# Patient Record
Sex: Male | Born: 1975 | Race: White | Hispanic: No | Marital: Married | Smoking: Never smoker
Health system: Southern US, Community
[De-identification: ages and names within clinical notes are randomized; demographics above are authoritative.]

## PROBLEM LIST (undated history)

## (undated) DIAGNOSIS — I10 Essential (primary) hypertension: Secondary | ICD-10-CM

## (undated) DIAGNOSIS — B029 Zoster without complications: Secondary | ICD-10-CM

## (undated) DIAGNOSIS — E785 Hyperlipidemia, unspecified: Secondary | ICD-10-CM

## (undated) DIAGNOSIS — T7840XA Allergy, unspecified, initial encounter: Secondary | ICD-10-CM

## (undated) HISTORY — PX: ANKLE SURGERY: SHX546

## (undated) HISTORY — DX: Hyperlipidemia, unspecified: E78.5

## (undated) HISTORY — PX: FRACTURE SURGERY: SHX138

## (undated) HISTORY — DX: Zoster without complications: B02.9

## (undated) HISTORY — DX: Allergy, unspecified, initial encounter: T78.40XA

## (undated) HISTORY — DX: Essential (primary) hypertension: I10

---

## 2005-01-22 ENCOUNTER — Emergency Department (HOSPITAL_COMMUNITY): Admission: EM | Admit: 2005-01-22 | Discharge: 2005-01-22 | Payer: Self-pay | Admitting: Emergency Medicine

## 2021-11-16 ENCOUNTER — Emergency Department (HOSPITAL_COMMUNITY)
Admission: EM | Admit: 2021-11-16 | Discharge: 2021-11-17 | Disposition: A | Discharge status: Home / Self Care | Payer: 59 | Attending: Emergency Medicine | Admitting: Emergency Medicine

## 2021-11-16 ENCOUNTER — Encounter (HOSPITAL_COMMUNITY): Payer: Self-pay | Admitting: Emergency Medicine

## 2021-11-16 ENCOUNTER — Emergency Department (HOSPITAL_COMMUNITY): Payer: 59

## 2021-11-16 DIAGNOSIS — I10 Essential (primary) hypertension: Secondary | ICD-10-CM | POA: Insufficient documentation

## 2021-11-16 DIAGNOSIS — R42 Dizziness and giddiness: Secondary | ICD-10-CM | POA: Insufficient documentation

## 2021-11-16 LAB — CBC WITH DIFFERENTIAL/PLATELET
Abs Immature Granulocytes: 0.04 10*3/uL (ref 0.00–0.07)
Basophils Absolute: 0.1 10*3/uL (ref 0.0–0.1)
Basophils Relative: 1 %
Eosinophils Absolute: 0.3 10*3/uL (ref 0.0–0.5)
Eosinophils Relative: 3 %
HCT: 47.3 % (ref 39.0–52.0)
Hemoglobin: 16.6 g/dL (ref 13.0–17.0)
Immature Granulocytes: 0 %
Lymphocytes Relative: 19 %
Lymphs Abs: 1.9 10*3/uL (ref 0.7–4.0)
MCH: 31.7 pg (ref 26.0–34.0)
MCHC: 35.1 g/dL (ref 30.0–36.0)
MCV: 90.3 fL (ref 80.0–100.0)
Monocytes Absolute: 1 10*3/uL (ref 0.1–1.0)
Monocytes Relative: 10 %
Neutro Abs: 7 10*3/uL (ref 1.7–7.7)
Neutrophils Relative %: 67 %
Platelets: 236 10*3/uL (ref 150–400)
RBC: 5.24 MIL/uL (ref 4.22–5.81)
RDW: 12.5 % (ref 11.5–15.5)
WBC: 10.3 10*3/uL (ref 4.0–10.5)
nRBC: 0 % (ref 0.0–0.2)

## 2021-11-16 LAB — URINALYSIS, ROUTINE W REFLEX MICROSCOPIC
Bilirubin Urine: NEGATIVE
Glucose, UA: NEGATIVE mg/dL
Hgb urine dipstick: NEGATIVE
Ketones, ur: NEGATIVE mg/dL
Leukocytes,Ua: NEGATIVE
Nitrite: NEGATIVE
Protein, ur: NEGATIVE mg/dL
Specific Gravity, Urine: 1.002 — ABNORMAL LOW (ref 1.005–1.030)
pH: 6 (ref 5.0–8.0)

## 2021-11-16 LAB — RAPID URINE DRUG SCREEN, HOSP PERFORMED
Amphetamines: NOT DETECTED
Barbiturates: NOT DETECTED
Benzodiazepines: NOT DETECTED
Cocaine: NOT DETECTED
Opiates: NOT DETECTED
Tetrahydrocannabinol: NOT DETECTED

## 2021-11-16 MED ORDER — AMLODIPINE BESYLATE 5 MG PO TABS
10.0000 mg | ORAL_TABLET | Freq: Once | ORAL | Status: AC
  Administered 2021-11-16: 10 mg via ORAL
  Filled 2021-11-16: qty 2

## 2021-11-16 MED ORDER — CLONIDINE HCL 0.1 MG PO TABS
0.1000 mg | ORAL_TABLET | Freq: Once | ORAL | Status: AC
Start: 2021-11-16 — End: 2021-11-16
  Administered 2021-11-16: 0.1 mg via ORAL
  Filled 2021-11-16: qty 1

## 2021-11-16 NOTE — ED Provider Notes (Signed)
[facility]-EMERGENCY DEPT Provider Note   CSN: [id number] Arrival date & time: 11/16/21  1847     History  Chief Complaint  Patient presents with   Hypertension    Tony Weber is a 46 y.o. male.  HPI Patient does not have formal diagnosis of hypertension.  He does however report that since college his blood pressures always been slightly elevated.  He reports over the past few years when he checks it its been as high as 160s over 90s.  Patient denies he is ever had treatment for hypertension.  He just recently got set up for treatment with a family physician.  He denies he has had any symptoms of chest pain, shortness of breath, headache or blurred vision.  Typically he feels well.  Today he got an episode of feeling kind of flushed and lightheaded when going up some stairs at the [facility] to an event with his wife this evening.  He reports he did not feel very well so he sat down and medics checked him.  His blood pressure was elevated at 200 systolic over 100.  He did not get transported to the hospital.  They did do an EKG tracing at that time which to gauge the patient he went home to rest and was feeling better.  However he checked his blood pressure at home and it was even higher up to 220/100s.  At that time he became concerned and they came to the emergency department for further evaluation.  Patient denies any time that he had any headache or blurred vision.  He did not have dizziness or syncope or near syncope.  No chest pain or shortness of breath.  No focal weakness numbness tingling.  He did not feel that his gait was incoordinated.  He did not feel that he had loss of vision or double vision.  Patient reports at the time that he was in the [facility] and looking down through the glass to the lower arena he had the feeling that his depth perception was not quite right but aside from that no visual complaint.  That then resolved and he felt back to normal. Patient  reports he drinks about 3-6 beers daily.  He has not had any alcohol today.  He does not smoke.  He takes a [employer] product called    Home Medications Prior to Admission medications   Medication Sig Start Date End Date Taking? Authorizing Provider  cetirizine (ZYRTEC) 10 MG tablet Take 10 mg by mouth daily.   Yes [provider]  Multiple Vitamins-Minerals (MEGA MULTIVITAMIN FOR MEN) TABS Take 1 tablet by mouth daily.   Yes [provider]  oxymetazoline (AFRIN) 0.05 % nasal spray Place 1 spray into both nostrils daily as needed for congestion.   Yes [provider]      Allergies    Amoxicillin and Chocolate    Review of Systems   Review of Systems  Physical Exam Updated Vital Signs BP (!) 181/116    Pulse 92    Temp 98.3 F (36.8 C)    Resp 20    Ht 5\' 9"  (1.753 m)    Wt 104.3 kg    SpO2 95%    BMI 33.97 kg/m  Physical Exam Constitutional:      Appearance: Normal appearance.  HENT:     Head: Normocephalic and atraumatic.     Mouth/Throat:     Mouth: Mucous membranes are moist.     Pharynx: Oropharynx is  clear.  Eyes:     Extraocular Movements: Extraocular movements intact.     Conjunctiva/sclera: Conjunctivae normal.     Pupils: Pupils are equal, round, and reactive to light.  Cardiovascular:     Rate and Rhythm: Normal rate and regular rhythm.  Pulmonary:     Effort: Pulmonary effort is normal.     Breath sounds: Normal breath sounds.  Abdominal:     General: There is no distension.     Palpations: Abdomen is soft.     Tenderness: There is no abdominal tenderness. There is no guarding.  Musculoskeletal:        General: No swelling or tenderness. Normal range of motion.     Cervical back: Neck supple.     Right lower leg: No edema.     Left lower leg: No edema.  Skin:    General: Skin is warm and dry.  Neurological:     General: No focal deficit present.     Mental Status: He is alert and oriented to person, place, and time.     Cranial  Nerves: No cranial nerve deficit.     Sensory: No sensory deficit.     Motor: No weakness.     Coordination: Coordination normal.     Comments: Normal cognitive function.  Speech is clear.  Normal finger-nose exam bilaterally.  Motor strength 5\5x4 extremities.  No sensory deficit.  Psychiatric:        Mood and Affect: Mood normal.    ED Results / Procedures / Treatments   Labs (all labs ordered are listed, but only abnormal results are displayed) Labs Reviewed  URINALYSIS, ROUTINE W REFLEX MICROSCOPIC - Abnormal; Notable for the following components:      Result Value   Color, Urine COLORLESS (*)    Specific Gravity, Urine 1.002 (*)    All other components within normal limits  CBC WITH DIFFERENTIAL/PLATELET  RAPID URINE DRUG SCREEN, HOSP PERFORMED  COMPREHENSIVE METABOLIC PANEL  TSH  TROPONIN I (HIGH SENSITIVITY)  TROPONIN I (HIGH SENSITIVITY)    EKG EKG Interpretation  Date/Time:  Sunday November 16 2021 21:49:51 EST Ventricular Rate:  79 PR Interval:  203 QRS Duration: 106 QT Interval:  376 QTC Calculation: 431 R Axis:   -24 Text Interpretation: Sinus rhythm Borderline prolonged PR interval Borderline left axis deviation Confirmed by Tilden Fossaees, Elizabeth [phone]([id number]) on 11/16/2021 11:31:01 PM  Radiology DG Chest 2 View  Result Date: 11/16/2021 CLINICAL DATA:  Hypertension EXAM: CHEST - 2 VIEW COMPARISON:  None. FINDINGS: The heart size and mediastinal contours are within normal limits. Both lungs are clear. The visualized skeletal structures are unremarkable. IMPRESSION: No active cardiopulmonary disease. Electronically Signed   By: Alcide CleverMark  Lukens M.D.   On: 11/16/2021 21:50   CT Head Wo Contrast  Result Date: 11/16/2021 CLINICAL DATA:  Hypertension with headaches, initial encounter EXAM: CT HEAD WITHOUT CONTRAST TECHNIQUE: Contiguous axial images were obtained from the base of the skull through the vertex without intravenous contrast. RADIATION DOSE REDUCTION: This exam was  performed according to the departmental dose-optimization program which includes automated exposure control, adjustment of the mA and/or kV according to patient size and/or use of iterative reconstruction technique. COMPARISON:  None. FINDINGS: Brain: No evidence of acute infarction, hemorrhage, hydrocephalus, extra-axial collection or mass lesion/mass effect. Vascular: No hyperdense vessel or unexpected calcification. Skull: Normal. Negative for fracture or focal lesion. Sinuses/Orbits: Mild mucosal changes are noted in the sphenoid sinus. Other: None. IMPRESSION: No acute intracranial abnormality noted. Mild mucosal thickening  within the sphenoid sinus. Electronically Signed   By: Alcide Clever M.D.   On: 11/16/2021 21:50    Procedures Procedures    Medications Ordered in ED Medications  amLODipine (NORVASC) tablet 10 mg (10 mg Oral Given 11/16/21 2152)  cloNIDine (CATAPRES) tablet 0.1 mg (0.1 mg Oral Given 11/16/21 2152)    ED Course/ Medical Decision Making/ A&P                           Medical Decision Making Amount and/or Complexity of Data Reviewed Labs: ordered. Radiology: ordered.  Risk Prescription drug management.  Patient has significantly elevated blood pressure without prior history of managed hypertension.  Patient describes probable untreated hypertension for a number of years.  He has been asymptomatic.  This evening he experienced symptoms of general lightheadedness and flushing.  Patient's initial blood pressure on arrival was 224/136.  However he does not endorse headache, visual changes, neurodeficit, chest pain or shortness of breath.  Diagnostic evaluation for hypertension initiated.  Patient given amlodipine 10 mg orally and clonidine 0.1.  History indicates patient is taking supplemental product from [facility] called [employer] men's performance and vitality.  At this time I would recommend discontinuing this product.  Unclear if any of the herbal supplements might precipitate  hypertension.   Patient does have established follow-up with a PCP although he has not yet started care.  At this time diagnostic evaluation initiated.  Dr. Madilyn Hook will follow-up on diagnostic studies.  Patient is not showing signs of endorgan damage.  If diagnostic work-up within normal limits and patient's blood pressure has improved, anticipate patient will be appropriate for discharge with prescription for amlodipine and starting to measure and log blood pressures at home.  discontinue all dietary supplements as it is unclear what the possible side effects may be.  Final Clinical Impression(s) / ED Diagnoses Final diagnoses:  Hypertension, unspecified type    Rx / DC Orders ED Discharge Orders     None         Arby Barrette, MD 11/22/21 1504

## 2021-11-16 NOTE — ED Provider Notes (Signed)
Patient care assumed at 2300.  Patient presented with feeling off and unwell, found to be significantly hypertensive.  Care assumed pending labs.  Labs with normal renal function, thyroid function as well as troponin.  He was treated with antihypertensives in the emergency department with improvement in his blood pressure.  On assessment patient is asymptomatic.  CT head without acute abnormality.  Discussed with patient elevated blood pressure.  Will prescribe amlodipine.  He plans to follow-up with PCP.  Discussed with patient home care for hypertension as well as return precautions.   Tilden Fossa, MD 11/17/21 [phone]

## 2021-11-16 NOTE — ED Triage Notes (Signed)
Patient stated he was feeling off and measured blood pressure with elevated pressures at home. Denies chest pain, SOB. Does not have PCP.

## 2021-11-17 LAB — COMPREHENSIVE METABOLIC PANEL
ALT: 55 U/L — ABNORMAL HIGH (ref 0–44)
AST: 36 U/L (ref 15–41)
Albumin: 4.7 g/dL (ref 3.5–5.0)
Alkaline Phosphatase: 64 U/L (ref 38–126)
Anion gap: 10 (ref 5–15)
BUN: 14 mg/dL (ref 6–20)
CO2: 24 mmol/L (ref 22–32)
Calcium: 9.4 mg/dL (ref 8.9–10.3)
Chloride: 105 mmol/L (ref 98–111)
Creatinine, Ser: 0.9 mg/dL (ref 0.61–1.24)
GFR, Estimated: >60 mL/min (ref >60)
Glucose, Bld: 86 mg/dL (ref 70–99)
Potassium: 3.5 mmol/L (ref 3.5–5.1)
Sodium: 139 mmol/L (ref 135–145)
Total Bilirubin: 1.3 mg/dL — ABNORMAL HIGH (ref 0.3–1.2)
Total Protein: 8 g/dL (ref 6.5–8.1)

## 2021-11-17 LAB — TSH: TSH: 1.821 u[IU]/mL (ref 0.350–4.500)

## 2021-11-17 LAB — TROPONIN I (HIGH SENSITIVITY): Troponin I (High Sensitivity): 8 ng/L (ref <18)

## 2021-11-17 MED ORDER — AMLODIPINE BESYLATE 10 MG PO TABS: 10.0000 mg | ORAL_TABLET | Freq: Every day | ORAL | 0 refills | Status: DC

## 2021-11-25 ENCOUNTER — Ambulatory Visit: Payer: PRIVATE HEALTH INSURANCE | Admitting: Student

## 2021-11-25 ENCOUNTER — Other Ambulatory Visit: Payer: Self-pay

## 2021-11-25 VITALS — BP 155/109 | HR 75 | Temp 98.3°F | Ht 69.0 in | Wt 231.8 lb

## 2021-11-25 DIAGNOSIS — E669 Obesity, unspecified: Secondary | ICD-10-CM

## 2021-11-25 DIAGNOSIS — E785 Hyperlipidemia, unspecified: Secondary | ICD-10-CM

## 2021-11-25 DIAGNOSIS — R7401 Elevation of levels of liver transaminase levels: Secondary | ICD-10-CM | POA: Diagnosis not present

## 2021-11-25 DIAGNOSIS — I10 Essential (primary) hypertension: Secondary | ICD-10-CM | POA: Diagnosis not present

## 2021-11-25 DIAGNOSIS — Z0000 Encounter for general adult medical examination without abnormal findings: Secondary | ICD-10-CM

## 2021-11-25 DIAGNOSIS — Z789 Other specified health status: Secondary | ICD-10-CM

## 2021-11-25 LAB — POCT GLYCOSYLATED HEMOGLOBIN (HGB A1C): Hemoglobin A1C: 5 % (ref 4.0–5.6)

## 2021-11-25 LAB — GLUCOSE, CAPILLARY: Glucose-Capillary: 115 mg/dL — ABNORMAL HIGH (ref 70–99)

## 2021-11-25 MED ORDER — HYDROCHLOROTHIAZIDE 12.5 MG PO TABS: 12.5000 mg | ORAL_TABLET | Freq: Every day | ORAL | 6 refills | Status: DC

## 2021-11-25 NOTE — Patient Instructions (Addendum)
It was a pleasure meeting you sir,   Regarding your blood pressure, because it is persistently elevated here in our office and with your readings we will add an additional medication called hydrochlorothiazide.   We will also check your blood sugars (A1c) and your cholesterol.   Please continue to work on cutting down the amount of beer you are drinking and the dipping as well.   We will see you back in 2 weeks for a lab only visit and if you are doing well on your blood pressure medications we will see you back in 3 months.   Please let us know if you have any questions or concerns.

## 2021-11-26 ENCOUNTER — Encounter: Payer: Self-pay | Admitting: Student

## 2021-11-26 DIAGNOSIS — E785 Hyperlipidemia, unspecified: Secondary | ICD-10-CM | POA: Insufficient documentation

## 2021-11-26 DIAGNOSIS — I10 Essential (primary) hypertension: Secondary | ICD-10-CM | POA: Insufficient documentation

## 2021-11-26 LAB — HCV INTERPRETATION

## 2021-11-26 LAB — LIPID PANEL
Chol/HDL Ratio: 4.1 ratio (ref 0.0–5.0)
Cholesterol, Total: 205 mg/dL — ABNORMAL HIGH (ref 100–199)
HDL: 50 mg/dL (ref >39)
LDL Chol Calc (NIH): 139 mg/dL — ABNORMAL HIGH (ref 0–99)
Triglycerides: 87 mg/dL (ref 0–149)
VLDL Cholesterol Cal: 16 mg/dL (ref 5–40)

## 2021-11-26 LAB — HIV ANTIBODY (ROUTINE TESTING W REFLEX): HIV Screen 4th Generation wRfx: NONREACTIVE

## 2021-11-26 LAB — HCV AB W REFLEX TO QUANT PCR: HCV Ab: <0.1 s/co ratio (ref 0.0–0.9)

## 2021-11-26 NOTE — Assessment & Plan Note (Signed)
Patient presented to the ED 1/15 with elevated blood pressure to the 200s systolic. On the day of presentation to the ED patient reports that he was in the [employer] with his wife. While on the second floor he had this feeling of nonspecific uneasiness. He denies chest pain, SOB, weakness, lightheadedness, presyncope or syncope, or dizziness. He eventually told his wife that he felt uncomfortable leaving [place] She subsequently motioned for EMT to come evaluate the patient and his blood pressure was noted to be in the 200s systolic. On presentation to the ED they again were elevated. He had a CT head which was negative for any acute intracranial process. TSH was WNL. CMP was notable for mildly elevated ALT and t bili but otherwise normal. EKG was NSR no evidence of ischemia acute or distant. Troponin's were negative. Patient was started on amlodipine 10mg  and discharged. Since leaving the ED he notes his blood pressures remain elevated in the 140-150s systolic. He is adherent with all of his medications. He decreased his drinking some but still consumes about 2-3 beers per night. He denies chest pain, lightheadedness, orthostasis.   Of note, patient reports that since he was in college he has had elevated blood pressures. During his college years these blood pressures were taken after he was active. However, intermittently over the last 5 years patient would take his BP in a retail pharmacy and notes his BP would 150-160s systolic. He never really followed up with a pcp or started any medication.   A/P: Patient's BP much improved from admission BP however remains elevated. Discussed with patient goal blood pressure and the importance of maintaining blood pressure at this goal. We also discussed how his alcohol intake is contributing to his elevated Bps.  -Add another antihypertensive HCTz 12.5mg  daily, BMP in 2 weeks  -Continue to cut back on the number of alcoholic  drinks you have -Continue checking daily Bps -If BP remains uncontrolled despite 2 agents, consider further work up of secondary HTN.

## 2021-11-26 NOTE — Progress Notes (Signed)
° °  CC: establish care recently seen in ED for elevated blood pressure   HPI:  Tony Weber is a 46 y.o. M with PMH per below who presents to establish care with our clinic after recently being seen in the ED for elevated blood pressure. Please see problem based charting under encounters tab for further details.    Past Medical History:  Diagnosis Date   Hyperlipidemia    Hypertension    Shingles    Review of Systems:  Please see problem based charting under encounters tab for further details.    Physical Exam:  Vitals:   11/25/21 0851 11/25/21 0940  BP: (!) 116/116 (!) 155/109  Pulse: 86 75  Temp: 98.3 F (36.8 C)   TempSrc: Oral   SpO2: 100%   Weight: 231 lb 12.8 oz (105.1 kg)   Height: 5\' 9"  (1.753 m)     Constitutional: Well-developed, well-nourished, and in no distress.  HENT:  Head: Normocephalic and atraumatic.  Eyes: EOM are normal.  Neck: Normal range of motion.  Cardiovascular: Normal rate, regular rhythm, intact distal pulses. No gallop and no friction rub.  No murmur heard. No lower extremity edema  Pulmonary: Non labored breathing on room air, no wheezing or rales  Abdominal: Soft. Normal bowel sounds. Non distended and non tender Musculoskeletal: Normal range of motion.        General: No tenderness or edema.  Neurological: Alert and oriented to person, place, and time. Non focal  Skin: Skin is warm and dry.    Assessment & Plan:   See Encounters Tab for problem based charting.  Patient discussed with Dr.  

## 2021-11-27 ENCOUNTER — Encounter: Payer: Self-pay | Admitting: Student

## 2021-11-27 DIAGNOSIS — R7401 Elevation of levels of liver transaminase levels: Secondary | ICD-10-CM | POA: Insufficient documentation

## 2021-11-27 DIAGNOSIS — Z789 Other specified health status: Secondary | ICD-10-CM | POA: Insufficient documentation

## 2021-11-27 NOTE — Assessment & Plan Note (Signed)
T chol is 205 and LDL is 139. His ASCVD risk score is 2.8%. Discussed with patient the importance of keeping his blood pressure under control and exercising.

## 2021-11-27 NOTE — Assessment & Plan Note (Signed)
Patient noted to have mildly elevated tbili and ALT on recent CMP. This is likely 2/2 his alcohol use. Discussed with patient that he should continue to work to decrease his alcohol intake as it is causing damage to his liver and we want to prevent progression to irreversible permanent damage.  -He endorsed understanding. Will reassess alcohol use at next clinic appointment and discuss medications if he continues to drink excessively.

## 2021-11-27 NOTE — Assessment & Plan Note (Signed)
Patient states he has been a 4-6 beer drinker a day since his early 29s. Since his visit to the ED he has decreased his intake to 2-3 beers. He does not feel like he drinks too much and does not need a drink when he first wakes up. His wife who accompanies him does feel like he drinks too much. Discussed with patient how his alcohol use is affecting his blood pressure and damaging his liver. He endorsed understanding and states that he will work to curb his alcohol intake even further. We will reassess at next clinic visit.

## 2021-11-27 NOTE — Progress Notes (Signed)
Internal Medicine Clinic Attending  Case discussed with Dr. Carter  At the time of the visit.  We reviewed the resident's history and exam and pertinent patient test results.  I agree with the assessment, diagnosis, and plan of care documented in the resident's note.  

## 2021-12-09 ENCOUNTER — Encounter: Payer: 59 | Admitting: Internal Medicine

## 2021-12-10 ENCOUNTER — Ambulatory Visit: Payer: 59 | Admitting: Internal Medicine

## 2021-12-10 VITALS — BP 152/99 | HR 83 | Wt 229.7 lb

## 2021-12-10 DIAGNOSIS — I10 Essential (primary) hypertension: Secondary | ICD-10-CM | POA: Diagnosis not present

## 2021-12-10 DIAGNOSIS — Z789 Other specified health status: Secondary | ICD-10-CM

## 2021-12-10 NOTE — Assessment & Plan Note (Signed)
BP Readings from Last 3 Encounters:  12/10/21 (!) 152/99  11/25/21 (!) 155/109  11/17/21 (!) 166/116   Patient presents for a follow up of his hypertension. Patient was initiated on amlodipine 10mg  daily in the ED on 1/15 when he presented with symptomatic hypertension. He was evaluated in clinic on 1/25 and recommended for addition of HCTZ 12.5mg  daily. Patient has been adherent to medication regimen and also endorses making lifestyle modifications including dietary changes and increasing aerobic exercise. Home blood pressure readings have improved to 120s/80s at home over the past week. Home BP machine compared to our clinic blood pressure machine for accuracy. Although does have persistently elevated blood pressure in clinic; will trend home BP readings.   Plan: Continue current regimen of amlodipine 10mg  daily and HCTZ 12.5mg  daily BMP today Follow up in 3 months

## 2021-12-10 NOTE — Assessment & Plan Note (Signed)
Patient endorses ongoing alcohol use; although improved from prior. Currently drinking 2-3 beers daily. He is continuing to decrease his alcohol intake and plans to completely stop drinking by March 2023. Offered naltrexone to assist with alcohol cessation; however, patient would like to hold off at this time.  Plan: Continue to encourage alcohol cessation

## 2021-12-10 NOTE — Progress Notes (Signed)
° °  CC: hypertension follow up  HPI:  [name] is a 46 y.o. male with PMHx as stated below presenting for hypertension follow up. Denies any acute concerns at this time. Please see problem based charting for complete assessment and plan.  Past Medical History:  Diagnosis Date   Hyperlipidemia    Hypertension    Shingles    Review of Systems:  Negative except as stated in HPI.  Physical Exam:  Vitals:   12/10/21 1426  BP: (!) 152/99  Pulse: 83  SpO2: 100%  Weight: 229 lb 11.2 oz (104.2 kg)   Physical Exam   Constitutional: Appears well-developed and well-nourished. No distress.  Cardiovascular: Normal rate, regular rhythm, S1 and S2 present, no murmurs, rubs, gallops.  Distal pulses intact Respiratory: Lungs are clear to auscultation bilaterally. Musculoskeletal: Normal bulk and tone.  No peripheral edema noted. Neurological: Is alert and oriented x4, no apparent focal deficits noted. Skin: Warm and dry.  No rash, erythema, lesions noted. Psychiatric: Normal mood and affect.    Assessment & Plan:   See Encounters Tab for problem based charting.  Patient discussed with Dr. Oswaldo Done

## 2021-12-10 NOTE — Patient Instructions (Signed)
Tony Weber,  It was a pleasure seeing you in clinic. Today we discussed:   Hypertension: Continue to take your amlodipine 10mg  daily and HCTZ 12.5mg  daily. Continue to monitor your blood pressures at home. Continue implementing your lifestyle changes including diet and exercise.  I will call you with any abnormal lab results.   Alcohol use: Great job on cutting down on your alcohol use thus far. Please keep it up. As discussed, we do have pharmacologic medications such as NALTREXONE that can help with alcohol cessation. Please let us know if you are interested in trying this.   If you have any questions or concerns, please call our clinic at [phone] between 9am-5pm and after hours call [phone] and ask for the internal medicine resident on call. If you feel you are having a medical emergency please call 911.   Thank you, we look forward to helping you remain healthy!

## 2021-12-11 NOTE — Progress Notes (Signed)
Internal Medicine Clinic Attending ° °Case discussed with Dr. Aslam  At the time of the visit.  We reviewed the resident’s history and exam and pertinent patient test results.  I agree with the assessment, diagnosis, and plan of care documented in the resident’s note.  °

## 2021-12-12 LAB — BMP8+ANION GAP
Anion Gap: 16 mmol/L (ref 10.0–18.0)
BUN/Creatinine Ratio: 18 (ref 9–20)
BUN: 17 mg/dL (ref 6–24)
CO2: 23 mmol/L (ref 20–29)
Calcium: 9.4 mg/dL (ref 8.7–10.2)
Chloride: 100 mmol/L (ref 96–106)
Creatinine, Ser: 0.97 mg/dL (ref 0.76–1.27)
Glucose: 88 mg/dL (ref 70–99)
Potassium: 4 mmol/L (ref 3.5–5.2)
Sodium: 139 mmol/L (ref 134–144)
eGFR: 99 mL/min/{1.73_m2} (ref >59)

## 2021-12-16 ENCOUNTER — Other Ambulatory Visit: Payer: Self-pay

## 2021-12-16 MED ORDER — AMLODIPINE BESYLATE 10 MG PO TABS: 10.0000 mg | ORAL_TABLET | Freq: Every day | ORAL | 0 refills | Status: DC

## 2021-12-16 NOTE — Telephone Encounter (Signed)
amLODipine (NORVASC) 10 MG tablet, REFILL REQUEST @ [facility] [id number] - Lady [place], [place].

## 2022-01-13 ENCOUNTER — Other Ambulatory Visit: Payer: Self-pay | Admitting: Student

## 2022-03-25 ENCOUNTER — Encounter: Payer: Self-pay | Admitting: Student

## 2022-03-25 ENCOUNTER — Ambulatory Visit (INDEPENDENT_AMBULATORY_CARE_PROVIDER_SITE_OTHER): Payer: Commercial Managed Care - PPO | Admitting: Student

## 2022-03-25 ENCOUNTER — Other Ambulatory Visit: Payer: Self-pay

## 2022-03-25 DIAGNOSIS — R21 Rash and other nonspecific skin eruption: Secondary | ICD-10-CM | POA: Diagnosis not present

## 2022-03-25 DIAGNOSIS — I10 Essential (primary) hypertension: Secondary | ICD-10-CM

## 2022-03-25 DIAGNOSIS — F1090 Alcohol use, unspecified, uncomplicated: Secondary | ICD-10-CM

## 2022-03-25 DIAGNOSIS — F1010 Alcohol abuse, uncomplicated: Secondary | ICD-10-CM

## 2022-03-25 DIAGNOSIS — F17220 Nicotine dependence, chewing tobacco, uncomplicated: Secondary | ICD-10-CM | POA: Diagnosis not present

## 2022-03-25 DIAGNOSIS — Z789 Other specified health status: Secondary | ICD-10-CM

## 2022-03-25 NOTE — Patient Instructions (Signed)
It was a pleasure seeing you in clinic today  Please try terbinafine cream twice daily to help with the rash on your foot. This is often sold under the brand name Lamisil.  If this does not improved in 2 weeks please follow up in clinic and we can do a skin biopsy to further investigate this.

## 2022-03-26 DIAGNOSIS — R21 Rash and other nonspecific skin eruption: Secondary | ICD-10-CM | POA: Insufficient documentation

## 2022-03-26 NOTE — Assessment & Plan Note (Signed)
Patient reports he has decreased to about 2 drinks every other day. He continues to be motivated to reduce drinking. He feels he is doing well with this and would like to hold of on naltrexone at this time. Encouraged to encourage alcohol cessation.

## 2022-03-26 NOTE — Progress Notes (Signed)
Established Patient Office Visit  Subjective   Patient ID: Tony Weber, male    DOB: 1976-10-03  Age: 46 y.o. MRN: [id number]  Chief Complaint  Patient presents with   Rash     Rash on ( right ) foot .. pt stated that it has been on going for  a month  and it is getting bigger as time goes by ... pt stated that it itch.. he also  stated that as it spread he  noticed  boils like sore on the back of his foot but his shoes do not go up that high     Tony Weber is a 46 year old man who presents today for follow up rash on the right ankle for the past 3-4 weeks.    Patient Active Problem List   Diagnosis Date Noted   Rash 03/26/2022   Transaminitis 11/27/2021   Alcohol use 11/27/2021   Hypertension 11/26/2021   Hyperlipidemia 11/26/2021      Review of Systems  All other systems reviewed and are negative.    Objective:     BP (!) 147/98 (BP Location: Left Arm, Cuff Size: Normal)   Pulse 66   Temp 97.7 F (36.5 C) (Oral)   Ht 5\' 10"  (1.778 m)   Wt 228 lb 4.8 oz (103.6 kg)   SpO2 99%   BMI 32.76 kg/m  BP Readings from Last 3 Encounters:  03/25/22 (!) 147/98  12/10/21 (!) 152/99  11/25/21 (!) 155/109      Physical Exam Constitutional:      General: He is not in acute distress.    Appearance: Normal appearance. He is obese.  HENT:     Mouth/Throat:     Mouth: Mucous membranes are moist.     Pharynx: Oropharynx is clear.  Cardiovascular:     Rate and Rhythm: Normal rate and regular rhythm.  Pulmonary:     Effort: Pulmonary effort is normal.     Breath sounds: Normal breath sounds.  Musculoskeletal:     Right lower leg: No edema.     Left lower leg: No edema.  Skin:    General: Skin is warm and dry.     Comments: Erythema around the of the right lateral malleolus with  scaling centrally around the malleolus, irregular borders, mild pain the palpation, no drainage or swelling, see photo below   Neurological:     General: No focal deficit present.     Mental  Status: He is alert and oriented to person, place, and time.  Psychiatric:        Mood and Affect: Mood normal.        Behavior: Behavior normal.     D      The 10-year ASCVD risk score (Arnett DK, et al., 2019) is: 2.5%    Assessment & Plan:   Problem List Items Addressed This Visit       Musculoskeletal and Integument   Rash    Patient with rash on the right ankle for 3-4 weeks. First noticed scaling and redness of the right lateral malleolus. Erythema expanded over the right heel and up the ankle. Has mild pain and itching around the site. He denies any unusual exposure or bug bites. Only outside to do yardwork recently. No other lesion elsewhere. He has tried topical lidocaine and anti itch lotions which somewhat help with symptoms. He has had prior ankle surgery in the area due to prior fracture in high school. Does not feel pain  from this is worse with the rash. Scaling and irregular borders suggest dermatophyte infection.  Plan Over the counter lamsil cream twice daily Follow up in 2 weeks if rash not improving may need biopsy of the rash         Other   Alcohol use    Patient reports he has decreased to about 2 drinks every other day. He continues to be motivated to reduce drinking. He feels he is doing well with this and would like to hold of on naltrexone at this time. Encouraged to encourage alcohol cessation.        Return in about 2 weeks (around 04/08/2022).    Quincy Simmonds, MD

## 2022-03-26 NOTE — Assessment & Plan Note (Signed)
Patient with rash on the right ankle for 3-4 weeks. First noticed scaling and redness of the right lateral malleolus. Erythema expanded over the right heel and up the ankle. Has mild pain and itching around the site. He denies any unusual exposure or bug bites. Only outside to do yardwork recently. No other lesion elsewhere. He has tried topical lidocaine and anti itch lotions which somewhat help with symptoms. He has had prior ankle surgery in the area due to prior fracture in high school. Does not feel pain from this is worse with the rash. Scaling and irregular borders suggest dermatophyte infection.  Plan Over the counter lamsil cream twice daily Follow up in 2 weeks if rash not improving may need biopsy of the rash

## 2022-03-26 NOTE — Assessment & Plan Note (Signed)
Endorses adherence to medication, home pressure log is normotensive. Reports previously brought home cuff to clinic and correlated with pressure in clinic. Likely he has white coat hypertension. Will continue on current regimen and BP at home have been normotensive.

## 2022-04-02 NOTE — Progress Notes (Signed)
Internal Medicine Clinic Attending  I saw and evaluated the patient.  I personally confirmed the key portions of the history and exam documented by the resident  and I reviewed pertinent patient test results.  The assessment, diagnosis, and plan were formulated together and I agree with the documentation in the resident's note.  

## 2022-04-24 ENCOUNTER — Ambulatory Visit
Admission: EM | Admit: 2022-04-24 | Discharge: 2022-04-24 | Disposition: A | Discharge status: Home / Self Care | Payer: Commercial Managed Care - PPO | Attending: Urgent Care | Admitting: Urgent Care

## 2022-04-24 DIAGNOSIS — B356 Tinea cruris: Secondary | ICD-10-CM | POA: Diagnosis not present

## 2022-04-24 MED ORDER — TRIAMCINOLONE ACETONIDE 0.1 % EX CREA: 1.0000 | TOPICAL_CREAM | Freq: Two times a day (BID) | CUTANEOUS | 0 refills | Status: DC

## 2022-04-24 MED ORDER — FLUCONAZOLE 150 MG PO TABS: 150.0000 mg | ORAL_TABLET | Freq: Once | ORAL | 0 refills | Status: AC

## 2022-04-24 MED ORDER — HYDROXYZINE HCL 25 MG PO TABS
12.5000 mg | ORAL_TABLET | Freq: Three times a day (TID) | ORAL | 0 refills | Status: DC | PRN
Start: 2022-04-24 — End: 2023-06-07

## 2022-04-24 MED ORDER — KETOCONAZOLE 2 % EX CREA: 1.0000 | TOPICAL_CREAM | Freq: Every day | CUTANEOUS | 1 refills | Status: DC

## 2022-06-18 ENCOUNTER — Other Ambulatory Visit: Payer: Self-pay | Admitting: Student

## 2022-06-18 DIAGNOSIS — I10 Essential (primary) hypertension: Secondary | ICD-10-CM

## 2022-06-18 MED ORDER — HYDROCHLOROTHIAZIDE 12.5 MG PO TABS: 12.5000 mg | ORAL_TABLET | Freq: Every day | ORAL | 3 refills | Status: DC

## 2022-06-18 NOTE — Telephone Encounter (Signed)
Pt is out of state working for next two weeks and is running out of the following medication.  The patient would like his med sent to the following pharmacy below as well.  hydrochlorothiazide (HYDRODIURIL) 12.5 MG tablet  [facility]er, maintenance in [place], [place] Service options: In-store shopping   Located in: [place] Address: [address] [place], [place] [address] Phone: [phone]

## 2022-06-19 ENCOUNTER — Other Ambulatory Visit: Payer: Self-pay | Admitting: Student

## 2022-06-19 DIAGNOSIS — I10 Essential (primary) hypertension: Secondary | ICD-10-CM

## 2022-07-09 ENCOUNTER — Other Ambulatory Visit: Payer: Self-pay | Admitting: Student

## 2023-01-05 ENCOUNTER — Other Ambulatory Visit: Payer: Self-pay

## 2023-01-05 MED ORDER — AMLODIPINE BESYLATE 10 MG PO TABS
10.0000 mg | ORAL_TABLET | Freq: Every day | ORAL | 1 refills | Status: DC
Start: 2023-01-05 — End: 2023-06-07

## 2023-01-25 IMAGING — CT CT HEAD W/O CM
3 of 5 series · 14 of 47 positions shown, 16 images · non-contrast
Comparison: None.

CLINICAL DATA: Hypertension with headaches, initial encounter



[Series 5: coronal soft tissue · coronal · 0.35mm/px · 3 of 73 slices shown]
[im 25/73  brain]
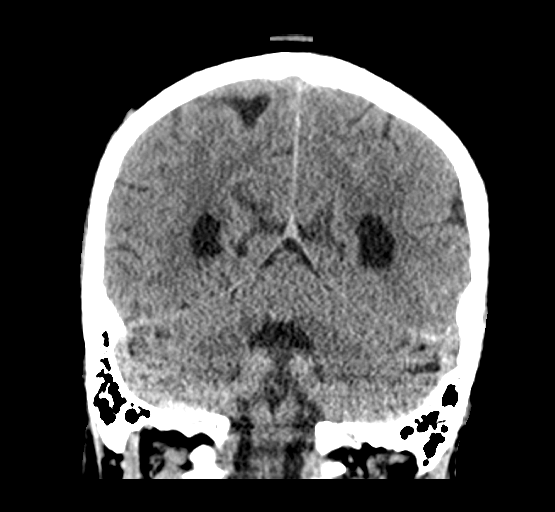
[im 33/73  brain]
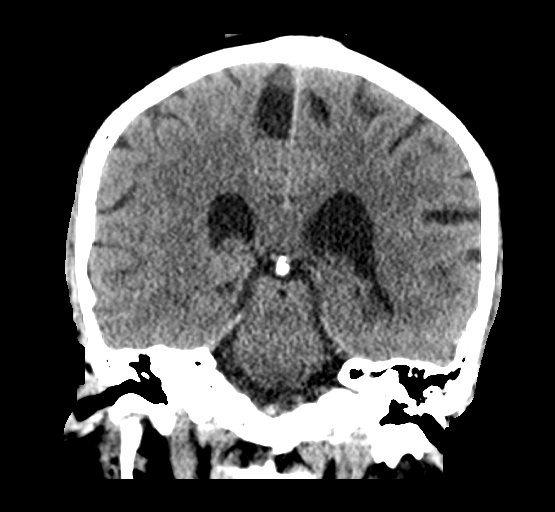
[im 41/73  brain]
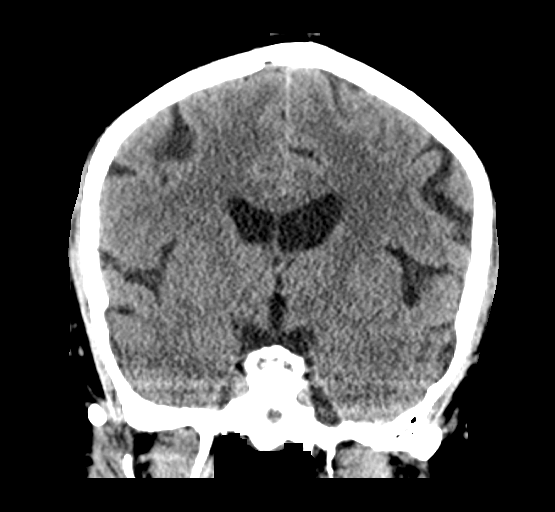

[Series 6: sagittal soft tissue · sagittal · 0.35mm/px · 3 of 65 slices shown]
[im 22/65  brain]
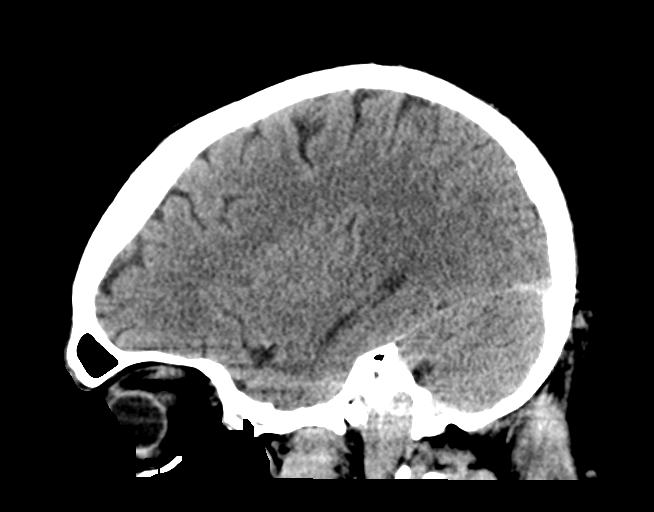
[im 33/65  brain]
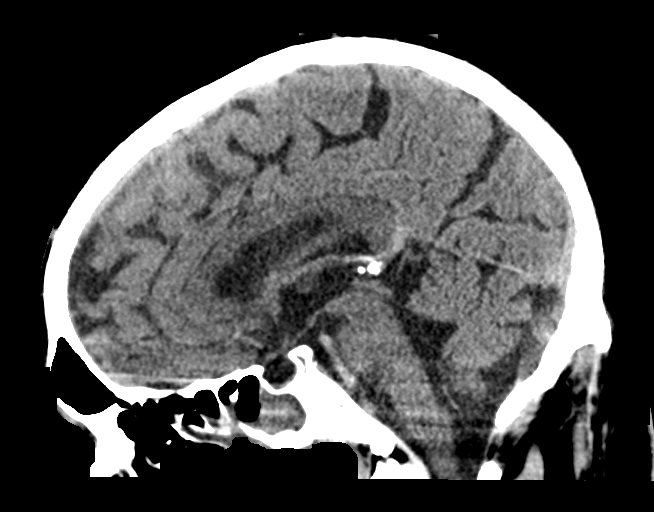
[im 43/65  brain]
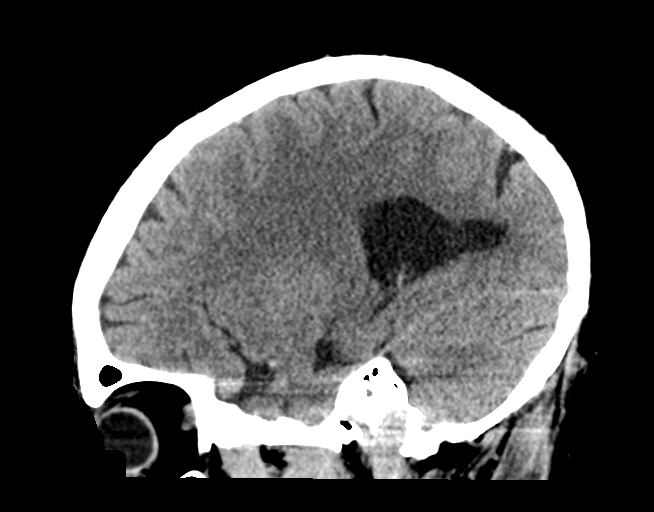

[Series 7: true axial · axial · 0.37mm/px · z∈[-138,-4]mm · 8 of 55 slices shown, 10 images]
[im 5/55  brain]
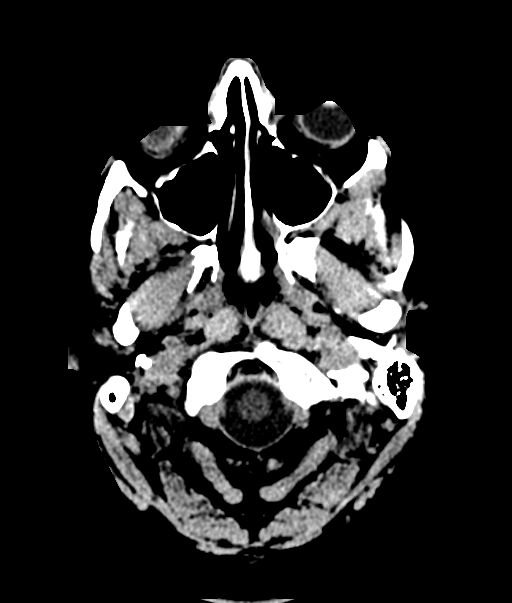
[im 5/55  bone]
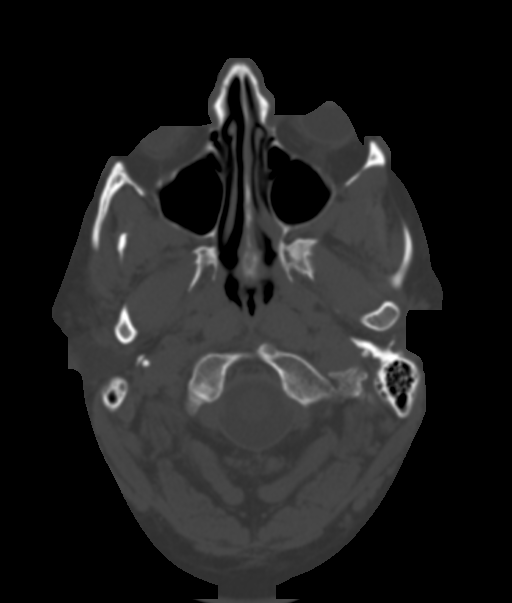
[im 10/55  brain]
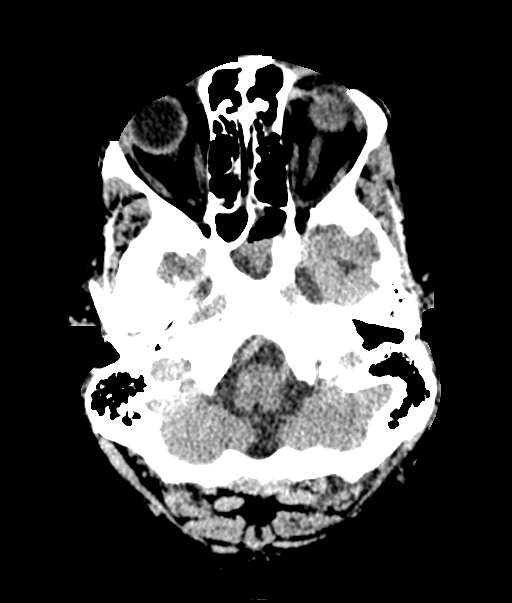
[im 20/55  brain]
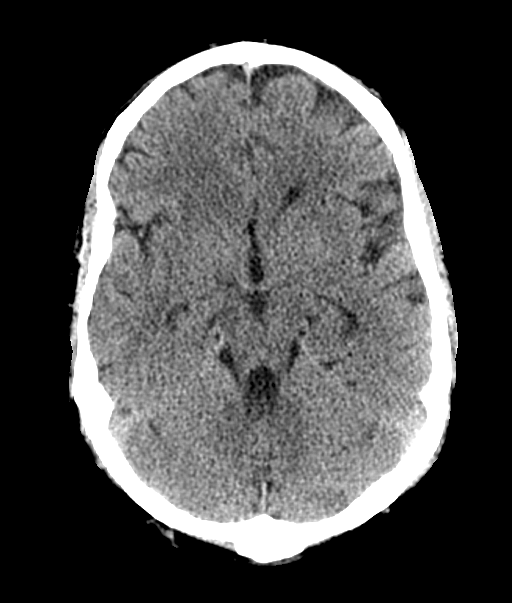
[im 25/55  brain]
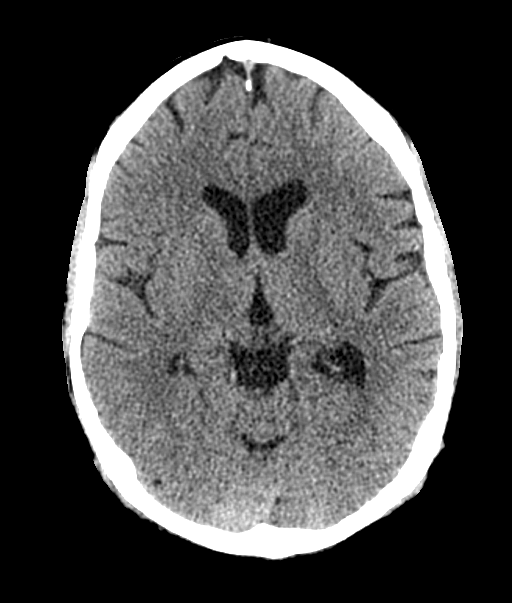
[im 30/55  brain]
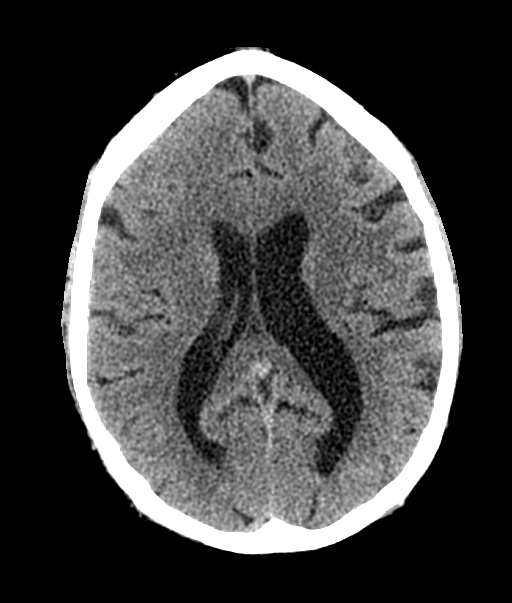
[im 30/55  bone]
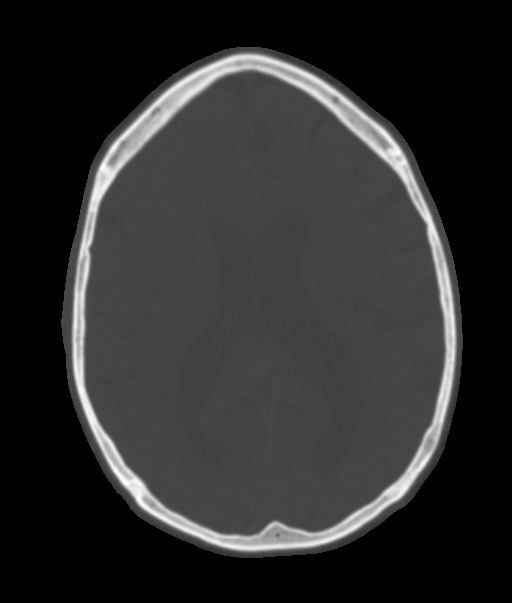
[im 35/55  brain]
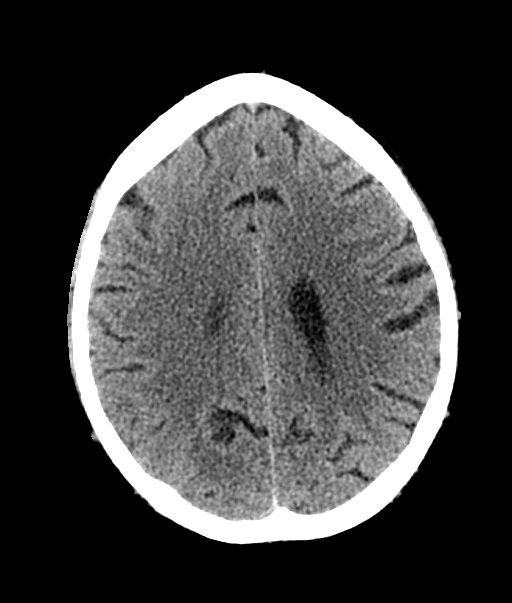
[im 45/55  brain]
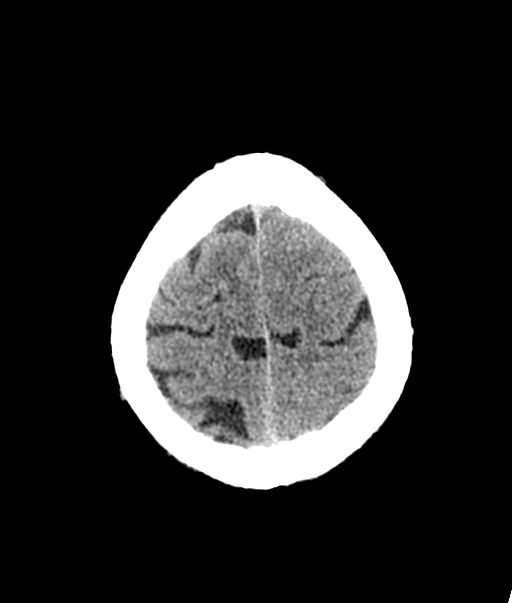
[im 50/55  brain]
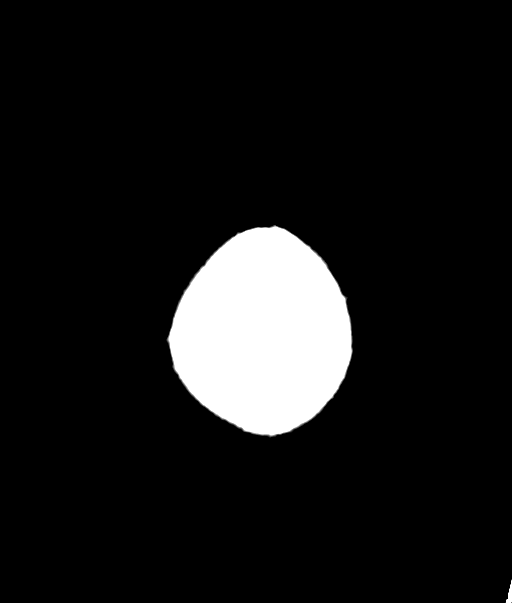

[14 of 47 positions shown; findings below may reference images not displayed]

FINDINGS: Brain: No evidence of acute infarction, hemorrhage, hydrocephalus,
extra-axial collection or mass lesion/mass effect.

Vascular: No hyperdense vessel or unexpected calcification.

Skull: Normal. Negative for fracture or focal lesion.

Sinuses/Orbits: Mild mucosal changes are noted in the sphenoid
sinus.

Other: None.
IMPRESSION: No acute intracranial abnormality noted.

Mild mucosal thickening within the sphenoid sinus.

## 2023-02-23 ENCOUNTER — Encounter: Payer: Self-pay | Admitting: *Deleted

## 2023-03-08 ENCOUNTER — Encounter: Payer: Commercial Managed Care - PPO | Admitting: Student

## 2023-03-22 ENCOUNTER — Encounter: Payer: Commercial Managed Care - PPO | Admitting: Student

## 2023-04-05 ENCOUNTER — Encounter: Payer: Self-pay | Admitting: *Deleted

## 2023-06-07 ENCOUNTER — Ambulatory Visit (INDEPENDENT_AMBULATORY_CARE_PROVIDER_SITE_OTHER): Payer: Commercial Managed Care - PPO | Admitting: Student

## 2023-06-07 ENCOUNTER — Encounter: Payer: Self-pay | Admitting: Student

## 2023-06-07 ENCOUNTER — Other Ambulatory Visit: Payer: Self-pay | Admitting: Student

## 2023-06-07 DIAGNOSIS — I10 Essential (primary) hypertension: Secondary | ICD-10-CM

## 2023-06-07 DIAGNOSIS — Z0000 Encounter for general adult medical examination without abnormal findings: Secondary | ICD-10-CM | POA: Insufficient documentation

## 2023-06-07 DIAGNOSIS — Z79899 Other long term (current) drug therapy: Secondary | ICD-10-CM | POA: Insufficient documentation

## 2023-06-07 MED ORDER — HYDROCHLOROTHIAZIDE 12.5 MG PO TABS
12.5000 mg | ORAL_TABLET | Freq: Every day | ORAL | 0 refills | Status: DC
Start: 2023-06-07 — End: 2023-06-17

## 2023-06-07 MED ORDER — AMLODIPINE BESYLATE 10 MG PO TABS
10.0000 mg | ORAL_TABLET | Freq: Every day | ORAL | 0 refills | Status: DC
Start: 2023-06-07 — End: 2023-06-17

## 2023-06-07 NOTE — Telephone Encounter (Signed)
LOV 03/25/22. Called pt to schedule an appt - no answer. Left message on self-identified vm to call our office to schedule an appt.

## 2023-06-07 NOTE — Assessment & Plan Note (Signed)
Has not been seen in the clinic for over a year. Will need CBC, BMP, Lipid Panel rechecked. May need tDAP and flu vaccine then. Advised to follow up within 90 days.

## 2023-06-07 NOTE — Progress Notes (Signed)
Established Patient Office Visit  Subjective   Patient ID: Tony Weber, male    DOB: 1976-07-07  Age: 47 y.o. MRN: [id number]  No chief complaint on file.   HPI  Tony Weber is a 47 y.o. having a phone visit today as he needs refills for his BP medications. Reports no episodes of hypotension, dizziness, or weakness. Reports no episodes of HA, blurriness of vision, neurological deficits. He has lost some weight as he is currently working at [employer] and is very active. He also is trying to eat more healthy. Has quit smoking and is trying to cut back on his alcohol use. He now drinks 16 beers a week vs the 21 he used to.   Patient was confirmed to be physically present in the state of [place].    Past Medical History:  Diagnosis Date   Hyperlipidemia    Hypertension    Shingles    Past Surgical History:  Procedure Laterality Date   ANKLE SURGERY Right    unspecified    Current Outpatient Medications:    amLODipine (NORVASC) 10 MG tablet, Take 1 tablet (10 mg total) by mouth daily., Disp: 90 tablet, Rfl: 0   cetirizine (ZYRTEC) 10 MG tablet, Take 10 mg by mouth daily., Disp: , Rfl:    hydrochlorothiazide (HYDRODIURIL) 12.5 MG tablet, Take 1 tablet (12.5 mg total) by mouth daily., Disp: 90 tablet, Rfl: 0   Multiple Vitamins-Minerals (MEGA MULTIVITAMIN FOR MEN) TABS, Take 1 tablet by mouth daily., Disp: , Rfl:    Allergies  Allergen Reactions   Amoxicillin Other (See Comments)    In hospital as a kid - not sure about reaction   Chocolate Diarrhea   Family History  Problem Relation Age of Onset   Liver cancer Father    Social History   Socioeconomic History   Marital status: Married    Spouse name: Not on file   Number of children: 2   Years of education: Not on file   Highest education level: Not on file  Occupational History   Not on file  Tobacco Use   Smoking status: Never   Smokeless tobacco: Former    Types: Chew    Quit date: 2023  Substance and Sexual  Activity   Alcohol use: Yes    Alcohol/week: 16.0 standard drinks of alcohol    Types: 16 Cans of beer per week   Drug use: Never   Sexual activity: Yes  Other Topics Concern   Not on file  Social History Narrative   Has 2 daughters and 2 grandchildren.    Social Determinants of Health   Financial Resource Strain: Not on file  Food Insecurity: Not on file  Transportation Needs: Not on file  Physical Activity: Not on file  Stress: Not on file  Social Connections: Not on file   ROS negative unless otherwise stated above.   Objective:     BP Readings from Last 3 Encounters:  04/24/22 (!) 148/101  03/25/22 (!) 147/98  12/10/21 (!) 152/99   Last CBC Lab Results  Component Value Date   WBC 10.3 11/16/2021   HGB 16.6 11/16/2021   HCT 47.3 11/16/2021   MCV 90.3 11/16/2021   MCH 31.7 11/16/2021   RDW 12.5 11/16/2021   PLT 236 11/16/2021   Last metabolic panel Lab Results  Component Value Date   GLUCOSE 88 12/10/2021   NA 139 12/10/2021   K 4.0 12/10/2021   CL 100 12/10/2021   CO2 23 12/10/2021  BUN 17 12/10/2021   CREATININE 0.97 12/10/2021   EGFR 99 12/10/2021   CALCIUM 9.4 12/10/2021   PROT 8.0 11/16/2021   ALBUMIN 4.7 11/16/2021   BILITOT 1.3 (H) 11/16/2021   ALKPHOS 64 11/16/2021   AST 36 11/16/2021   ALT 55 (H) 11/16/2021   ANIONGAP 10 11/16/2021   Last lipids Lab Results  Component Value Date   CHOL 205 (H) 11/25/2021   HDL 50 11/25/2021   LDLCALC 139 (H) 11/25/2021   TRIG 87 11/25/2021   CHOLHDL 4.1 11/25/2021   Last hemoglobin A1c Lab Results  Component Value Date   HGBA1C 5.0 11/25/2021   Last vitamin D No results found for: "25OHVITD2", "25OHVITD3", "VD25OH" Last vitamin B12 and Folate No results found for: "VITAMINB12", "FOLATE"  The ASCVD Risk score (Arnett DK, et al., 2019) failed to calculate for the following reasons:   The systolic blood pressure is missing    Assessment & Plan:   Problem List Items Addressed This Visit        Cardiovascular and Mediastinum   Hypertension    Tony Weber is a 47 y.o. having a phone visit today as he needs refills for his BP medications. Reports no episodes of hypotension, dizziness, or weakness. Reports no episodes of HA, blurriness of vision, neurological deficits. He has lost some weight as he is currently working at [employer] and is very active. He also is trying to eat more healthy. Has quit smoking and is trying to cut back on his alcohol use. He now drinks 16 beers a week vs the 21 he used to.  -Continue with lifestyle modifications  -Refill for HCZ 12.5mg  daily  -Refill for amlodipine 10mg  daily  -Refills sent only for 90 days as he will need to come in to the clinic for labs  -Check BMP and lipid panel at next visit      Relevant Medications   hydrochlorothiazide (HYDRODIURIL) 12.5 MG tablet   amLODipine (NORVASC) 10 MG tablet     Other   Healthcare maintenance - Primary    Has not been seen in the clinic for over a year. Will need CBC, BMP, Lipid Panel rechecked. May need tDAP and flu vaccine then. Advised to follow up within 90 days.        Return in about 3 months (around 09/07/2023).   Case discussed with Dr. Antony Contras.  Manuela Neptune, MD

## 2023-06-07 NOTE — Assessment & Plan Note (Signed)
Tony Weber is a 47 y.o. having a phone visit today as he needs refills for his BP medications. Reports no episodes of hypotension, dizziness, or weakness. Reports no episodes of HA, blurriness of vision, neurological deficits. He has lost some weight as he is currently working at [employer] and is very active. He also is trying to eat more healthy. Has quit smoking and is trying to cut back on his alcohol use. He now drinks 16 beers a week vs the 21 he used to.  -Continue with lifestyle modifications  -Refill for HCZ 12.5mg  daily  -Refill for amlodipine 10mg  daily  -Refills sent only for 90 days as he will need to come in to the clinic for labs  -Check BMP and lipid panel at next visit

## 2023-06-08 NOTE — Progress Notes (Signed)
Internal Medicine Clinic Attending  I was physically present during the key portions of the resident provided service and participated in the medical decision making of patient's management care. I reviewed pertinent patient test results.  The assessment, diagnosis, and plan were formulated together and I agree with the documentation in the resident's note.  Kampbell Holaway, MD  

## 2023-06-11 NOTE — Progress Notes (Signed)
  [facility] Internal Medicine Residency Telephone Encounter Continuity Care Appointment  HPI:  This telephone encounter was created for Mr. Tony Weber on 06/11/2023 for the following purpose/cc as he needs refills for his BP medications. Reports no episodes of hypotension, dizziness, or weakness. Reports no episodes of HA, blurriness of vision, neurological deficits. He has lost some weight as he is currently working at [employer] and is very active. He also is trying to eat more healthy. Has quit smoking and is trying to cut back on his alcohol use. He now drinks 16 beers a week vs the 21 he used to.    Past Medical History:  Past Medical History:  Diagnosis Date   Hyperlipidemia    Hypertension    Shingles      ROS:  Negative unless otherwise stated above.    Assessment / Plan / Recommendations:  Please see A&P under problem oriented charting for assessment of the patient's acute and chronic medical conditions.  As always, pt is advised that if symptoms worsen or new symptoms arise, they should go to an urgent care facility or to to ER for further evaluation.   Consent and Medical Decision Making:  Patient discussed with Dr. Antony Contras This is a telephone encounter between Tony Weber and [name] on 06/11/2023 for BP medication refill. The visit was conducted with the patient located at home and [name] at [facility]. The patient's identity was confirmed using their DOB and current address. The patient has consented to being evaluated through a telephone encounter and understands the associated risks (an examination cannot be done and the patient may need to come in for an appointment) / benefits (allows the patient to remain at home, decreasing exposure to coronavirus). I personally spent 20 minutes on medical discussion.

## 2023-06-17 ENCOUNTER — Encounter: Payer: Self-pay | Admitting: Student

## 2023-06-17 ENCOUNTER — Ambulatory Visit: Payer: Commercial Managed Care - PPO | Admitting: Student

## 2023-06-17 VITALS — BP 151/95 | HR 73 | Temp 98.2°F | Ht 69.0 in | Wt 233.2 lb

## 2023-06-17 DIAGNOSIS — F1090 Alcohol use, unspecified, uncomplicated: Secondary | ICD-10-CM | POA: Diagnosis not present

## 2023-06-17 DIAGNOSIS — I10 Essential (primary) hypertension: Secondary | ICD-10-CM

## 2023-06-17 DIAGNOSIS — Z6834 Body mass index (BMI) 34.0-34.9, adult: Secondary | ICD-10-CM | POA: Diagnosis not present

## 2023-06-17 DIAGNOSIS — Z789 Other specified health status: Secondary | ICD-10-CM

## 2023-06-17 DIAGNOSIS — E669 Obesity, unspecified: Secondary | ICD-10-CM

## 2023-06-17 DIAGNOSIS — Z87891 Personal history of nicotine dependence: Secondary | ICD-10-CM

## 2023-06-17 DIAGNOSIS — Z0000 Encounter for general adult medical examination without abnormal findings: Secondary | ICD-10-CM

## 2023-06-17 MED ORDER — HYDROCHLOROTHIAZIDE 12.5 MG PO TABS
12.5000 mg | ORAL_TABLET | Freq: Every day | ORAL | 0 refills | Status: DC
Start: 2023-06-17 — End: 2023-06-17

## 2023-06-17 MED ORDER — HYDROCHLOROTHIAZIDE 12.5 MG PO TABS: 12.5000 mg | ORAL_TABLET | Freq: Every day | ORAL | 3 refills | Status: DC

## 2023-06-17 MED ORDER — AMLODIPINE BESYLATE 10 MG PO TABS
10.0000 mg | ORAL_TABLET | Freq: Every day | ORAL | 0 refills | Status: DC
Start: 2023-06-17 — End: 2023-06-17

## 2023-06-17 MED ORDER — AMLODIPINE BESYLATE 10 MG PO TABS
10.0000 mg | ORAL_TABLET | Freq: Every day | ORAL | 3 refills | Status: DC
Start: 2023-06-17 — End: 2023-12-28

## 2023-06-17 NOTE — Assessment & Plan Note (Signed)
-  Hgb A1c today -Lifestyle modifications can aid with BP control.

## 2023-06-17 NOTE — Assessment & Plan Note (Signed)
Counseled on resources available should he consider quitting.  On multivitamins Eating meals, although some days will only eat once.  -Not ready to quit today.

## 2023-06-17 NOTE — Patient Instructions (Addendum)
Thank you, Mr.Maxine Basham for allowing [place] to provide your care today. Today we discussed that your blood pressure cuff may be under-reading your blood pressure.   Make sure you are taking your medications.   I have ordered the following labs for you:  Lab Orders         CMP14 + Anion Gap         Hemoglobin A1c      I have ordered the following medication/changed the following medications:   Continue taking the following medications: Meds ordered this encounter  Medications   hydrochlorothiazide (HYDRODIURIL) 12.5 MG tablet    Sig: Take 1 tablet (12.5 mg total) by mouth daily.    Dispense:  90 tablet    Refill:  3   amLODipine (NORVASC) 10 MG tablet    Sig: Take 1 tablet (10 mg total) by mouth daily.    Dispense:  90 tablet    Refill:  3     Remember: To follow-up with [place] in 3 months   Should you have any questions or concerns please call the internal medicine clinic at [phone].    Manuela Neptune, MD [facility]

## 2023-06-17 NOTE — Progress Notes (Signed)
Established Patient Office Visit  Subjective   Patient ID: Tony Weber, male    DOB: September 28, 1976  Age: 47 y.o. MRN: [id number]  Chief Complaint  Patient presents with   Hypertension    HPI  Today before coming here at 2pm 135/80 usually been consistent in the mid 130s  Always stressed to come here.   For more details please refer to the A&P portion of this note.   Past Medical History:  Diagnosis Date   Hyperlipidemia    Hypertension    Shingles    Review of Systems  Constitutional:  Negative for chills, fever, malaise/fatigue and weight loss.  Respiratory:  Negative for cough and shortness of breath.   Cardiovascular:  Negative for chest pain, palpitations and claudication.  Gastrointestinal:  Negative for abdominal pain, constipation and diarrhea.  Genitourinary:  Negative for dysuria and urgency.  Skin:  Negative for rash.  Psychiatric/Behavioral:  Positive for substance abuse (alcohol 3-4 beers 4 days a week). Negative for depression and suicidal ideas.      Objective:     BP (!) 151/95 (BP Location: Left Arm, Patient Position: Sitting, Cuff Size: Normal)   Pulse 73   Temp 98.2 F (36.8 C) (Oral)   Ht 5\' 9"  (1.753 m)   Wt 233 lb 3.2 oz (105.8 kg)   SpO2 99%   BMI 34.44 kg/m  BP Readings from Last 3 Encounters:  06/17/23 (!) 151/95  04/24/22 (!) 148/101  03/25/22 (!) 147/98   Wt Readings from Last 3 Encounters:  06/17/23 233 lb 3.2 oz (105.8 kg)  03/25/22 228 lb 4.8 oz (103.6 kg)  12/10/21 229 lb 11.2 oz (104.2 kg)    Physical Exam Constitutional:      General: He is not in acute distress.    Appearance: He is not toxic-appearing.  Cardiovascular:     Rate and Rhythm: Normal rate and regular rhythm.     Pulses: Normal pulses.     Heart sounds: No murmur heard.    No friction rub. No gallop.  Pulmonary:     Effort: No respiratory distress.     Breath sounds: No wheezing, rhonchi or rales.  Abdominal:     General: Abdomen is flat. There is no  distension or abdominal bruit.     Palpations: There is no hepatomegaly or splenomegaly.     Tenderness: There is no abdominal tenderness.  Musculoskeletal:        General: No swelling or tenderness.  Skin:    Coloration: Skin is not jaundiced.  Neurological:     Mental Status: He is alert.   Last CBC Lab Results  Component Value Date   WBC 10.3 11/16/2021   HGB 16.6 11/16/2021   HCT 47.3 11/16/2021   MCV 90.3 11/16/2021   MCH 31.7 11/16/2021   RDW 12.5 11/16/2021   PLT 236 11/16/2021   Last metabolic panel Lab Results  Component Value Date   GLUCOSE 88 12/10/2021   NA 139 12/10/2021   K 4.0 12/10/2021   CL 100 12/10/2021   CO2 23 12/10/2021   BUN 17 12/10/2021   CREATININE 0.97 12/10/2021   EGFR 99 12/10/2021   CALCIUM 9.4 12/10/2021   PROT 8.0 11/16/2021   ALBUMIN 4.7 11/16/2021   BILITOT 1.3 (H) 11/16/2021   ALKPHOS 64 11/16/2021   AST 36 11/16/2021   ALT 55 (H) 11/16/2021   ANIONGAP 10 11/16/2021   Last lipids Lab Results  Component Value Date   CHOL 205 (H) 11/25/2021  HDL 50 11/25/2021   LDLCALC 139 (H) 11/25/2021   TRIG 87 11/25/2021   CHOLHDL 4.1 11/25/2021   Last hemoglobin A1c Lab Results  Component Value Date   HGBA1C 5.0 11/25/2021    The 10-year ASCVD risk score (Arnett DK, et al., 2019) is: 3%    Assessment & Plan:   Problem List Items Addressed This Visit       Cardiovascular and Mediastinum   Hypertension    Comng in for hypertension. BP has been repeatedly elevated in the 160s and 150s today. BP cuff with pt today showed Bps in the 140s. BP may be reading lower than it should. Pt endorses BP at 130s at home so he does have some component of white coat HTN.  Denies HA, vision disturbance, leg edema, chest pain, SOB.  -Refills on amlodipine and HCZ today. Mail order for HCZ due to insurance coverage.  -Alcohol cessation counseling provided.  -Lifestyle modifications for now. -RTC in 3 months, may consider adding another medication  if BP remains elevated.  -CMP today      Relevant Medications   hydrochlorothiazide (HYDRODIURIL) 12.5 MG tablet   amLODipine (NORVASC) 10 MG tablet     Other   Alcohol use    Counseled on resources available should he consider quitting.  On multivitamins Eating meals, although some days will only eat once.  -Not ready to quit today.       Healthcare maintenance - Primary    Will turn 47 in September and will be due for colonoscopy.  -Referral to GI at next visit.  -Should get Tdap and flu shot at next visit.       Relevant Orders   CMP14 + Anion Gap   Hemoglobin A1c   Obesity (BMI 30.0-34.9)    -Hgb A1c today -Lifestyle modifications can aid with BP control.        Return in about 3 months (around 09/17/2023).    Manuela Neptune, MD

## 2023-06-17 NOTE — Assessment & Plan Note (Addendum)
Will turn 47 in September and will be due for colonoscopy.  -Referral to GI at next visit.  -Should get Tdap and flu shot at next visit.

## 2023-06-17 NOTE — Assessment & Plan Note (Addendum)
Comng in for hypertension. BP has been repeatedly elevated in the 160s and 150s today. BP cuff with pt today showed Bps in the 140s. BP may be reading lower than it should. Pt endorses BP at 130s at home so he does have some component of white coat HTN.  Denies HA, vision disturbance, leg edema, chest pain, SOB.  -Refills on amlodipine and HCZ today. Mail order for HCZ due to insurance coverage.  -Alcohol cessation counseling provided.  -Lifestyle modifications for now. -RTC in 3 months, may consider adding another medication if BP remains elevated.  -CMP today

## 2023-06-18 NOTE — Progress Notes (Signed)
Hgb A1c WNL. Pt is not diabetic or prediabetic.

## 2023-06-21 LAB — CMP14 + ANION GAP
ALT: 62 IU/L — ABNORMAL HIGH (ref 0–44)
AST: 29 IU/L (ref 0–40)
Albumin: 4.7 g/dL (ref 4.1–5.1)
Alkaline Phosphatase: 80 IU/L (ref 44–121)
Anion Gap: 17 mmol/L (ref 10.0–18.0)
BUN/Creatinine Ratio: 18 (ref 9–20)
BUN: 17 mg/dL (ref 6–24)
Bilirubin Total: 0.4 mg/dL (ref 0.0–1.2)
CO2: 20 mmol/L (ref 20–29)
Calcium: 9.3 mg/dL (ref 8.7–10.2)
Chloride: 101 mmol/L (ref 96–106)
Creatinine, Ser: 0.97 mg/dL (ref 0.76–1.27)
Globulin, Total: 2.4 g/dL (ref 1.5–4.5)
Glucose: 86 mg/dL (ref 70–99)
Potassium: 3.9 mmol/L (ref 3.5–5.2)
Sodium: 138 mmol/L (ref 134–144)
Total Protein: 7.1 g/dL (ref 6.0–8.5)
eGFR: 99 mL/min/{1.73_m2} (ref >59)

## 2023-06-21 LAB — HEMOGLOBIN A1C
Est. average glucose Bld gHb Est-mCnc: 105 mg/dL
Hgb A1c MFr Bld: 5.3 % (ref 4.8–5.6)

## 2023-06-21 NOTE — Progress Notes (Signed)
Pt with transaminitis ALT at 62 was 55 last year. AST WNL. Usually elevations in AST >2 are seen with alcohol use, but it is ALT that is elevated in Mr. Tony Weber. Father has a history of liver cancer. Pt is asymptomatic at this time, no RUQ pain, hepatomegaly, or masses palpated on exam. No B symptoms. Hep C negative from 11/2021 was negative. No hepatitis B. Bilirubin and Alk phos WNL. Will continue to monitor and could benefit from checking hepatitis B labs at next visit.

## 2023-06-23 NOTE — Progress Notes (Signed)
 Internal Medicine Clinic Attending  I was physically present during the key portions of the resident provided service and participated in the medical decision making of patient's management care. I reviewed pertinent patient test results.  The assessment, diagnosis, and plan were formulated together and I agree with the documentation in the resident's note.  Mercie Eon, MD

## 2023-09-05 ENCOUNTER — Other Ambulatory Visit: Payer: Self-pay | Admitting: Student

## 2023-09-05 DIAGNOSIS — I10 Essential (primary) hypertension: Secondary | ICD-10-CM

## 2023-12-27 NOTE — Progress Notes (Unsigned)
 New Patient Office Visit  Subjective    Patient ID: Tony Weber, male    DOB: 23-Jan-1976  Age: 48 y.o. MRN: [id number]  CC: No chief complaint on file.   HPI Tony Weber presents to establish care ***  Outpatient Encounter Medications as of 12/28/2023  Medication Sig   amLODipine (NORVASC) 10 MG tablet Take 1 tablet (10 mg total) by mouth daily.   cetirizine (ZYRTEC) 10 MG tablet Take 10 mg by mouth daily.   hydrochlorothiazide (HYDRODIURIL) 12.5 MG tablet Take 1 tablet (12.5 mg total) by mouth daily.   Multiple Vitamins-Minerals (MEGA MULTIVITAMIN FOR MEN) TABS Take 1 tablet by mouth daily.   No facility-administered encounter medications on file as of 12/28/2023.    Past Medical History:  Diagnosis Date   Hyperlipidemia    Hypertension    Shingles     Past Surgical History:  Procedure Laterality Date   ANKLE SURGERY Right    unspecified    Family History  Problem Relation Age of Onset   Liver cancer Father     Social History   Socioeconomic History   Marital status: Married    Spouse name: Not on file   Number of children: 2   Years of education: Not on file   Highest education level: Not on file  Occupational History   Not on file  Tobacco Use   Smoking status: Never   Smokeless tobacco: Former    Types: Chew    Quit date: 2023  Substance and Sexual Activity   Alcohol use: Yes    Alcohol/week: 16.0 standard drinks of alcohol    Types: 16 Cans of beer per week   Drug use: Never   Sexual activity: Yes  Other Topics Concern   Not on file  Social History Narrative   Has 2 daughters and 2 grandchildren.    Social Drivers of Corporate investment banker Strain: Not on file  Food Insecurity: Not on file  Transportation Needs: Not on file  Physical Activity: Not on file  Stress: Not on file  Social Connections: Not on file  Intimate Partner Violence: Not on file    ROS Per HPI      Objective    There were no vitals taken for this  visit.  Physical Exam Vitals and nursing note reviewed.  Constitutional:      Appearance: Normal appearance.  HENT:     Head: Normocephalic and atraumatic.  Eyes:     Extraocular Movements: Extraocular movements intact.  Cardiovascular:     Rate and Rhythm: Normal rate and regular rhythm.     Pulses: Normal pulses.     Heart sounds: Normal heart sounds.  Pulmonary:     Effort: Pulmonary effort is normal.     Breath sounds: Normal breath sounds.  Musculoskeletal:        General: Normal range of motion.     Cervical back: Normal range of motion.  Skin:    General: Skin is warm and dry.  Neurological:     General: No focal deficit present.     Mental Status: He is alert and oriented to person, place, and time.  Psychiatric:        Mood and Affect: Mood normal.        Behavior: Behavior normal.         Assessment & Plan:   There are no diagnoses linked to this encounter.   No follow-ups on file.   Moshe Cipro, FNP

## 2023-12-28 ENCOUNTER — Ambulatory Visit: Payer: Commercial Managed Care - PPO | Admitting: Family Medicine

## 2023-12-28 ENCOUNTER — Encounter: Payer: Self-pay | Admitting: Family Medicine

## 2023-12-28 ENCOUNTER — Ambulatory Visit (INDEPENDENT_AMBULATORY_CARE_PROVIDER_SITE_OTHER): Payer: Commercial Managed Care - PPO

## 2023-12-28 VITALS — BP 138/98 | HR 96 | Temp 98.5°F | Ht 69.0 in | Wt 232.0 lb

## 2023-12-28 DIAGNOSIS — I10 Essential (primary) hypertension: Secondary | ICD-10-CM | POA: Diagnosis not present

## 2023-12-28 DIAGNOSIS — F1090 Alcohol use, unspecified, uncomplicated: Secondary | ICD-10-CM

## 2023-12-28 DIAGNOSIS — M79671 Pain in right foot: Secondary | ICD-10-CM | POA: Diagnosis not present

## 2023-12-28 DIAGNOSIS — R739 Hyperglycemia, unspecified: Secondary | ICD-10-CM

## 2023-12-28 DIAGNOSIS — E785 Hyperlipidemia, unspecified: Secondary | ICD-10-CM

## 2023-12-28 DIAGNOSIS — R7401 Elevation of levels of liver transaminase levels: Secondary | ICD-10-CM

## 2023-12-28 DIAGNOSIS — Z789 Other specified health status: Secondary | ICD-10-CM | POA: Diagnosis not present

## 2023-12-28 DIAGNOSIS — L989 Disorder of the skin and subcutaneous tissue, unspecified: Secondary | ICD-10-CM

## 2023-12-28 DIAGNOSIS — M19071 Primary osteoarthritis, right ankle and foot: Secondary | ICD-10-CM

## 2023-12-28 LAB — COMPREHENSIVE METABOLIC PANEL
ALT: 67 U/L — ABNORMAL HIGH (ref 0–53)
AST: 38 U/L — ABNORMAL HIGH (ref 0–37)
Albumin: 4.8 g/dL (ref 3.5–5.2)
Alkaline Phosphatase: 69 U/L (ref 39–117)
BUN: 14 mg/dL (ref 6–23)
CO2: 28 meq/L (ref 19–32)
Calcium: 9.3 mg/dL (ref 8.4–10.5)
Chloride: 103 meq/L (ref 96–112)
Creatinine, Ser: 1.04 mg/dL (ref 0.40–1.50)
GFR: 86.74 mL/min (ref >60.00)
Glucose, Bld: 117 mg/dL — ABNORMAL HIGH (ref 70–99)
Potassium: 4.1 meq/L (ref 3.5–5.1)
Sodium: 140 meq/L (ref 135–145)
Total Bilirubin: 1.2 mg/dL (ref 0.2–1.2)
Total Protein: 8 g/dL (ref 6.0–8.3)

## 2023-12-28 LAB — LIPID PANEL
Cholesterol: 242 mg/dL — ABNORMAL HIGH (ref 0–200)
HDL: 62.7 mg/dL (ref >39.00)
LDL Cholesterol: 165 mg/dL — ABNORMAL HIGH (ref 0–99)
NonHDL: 179.31
Total CHOL/HDL Ratio: 4
Triglycerides: 73 mg/dL (ref 0.0–149.0)
VLDL: 14.6 mg/dL (ref 0.0–40.0)

## 2023-12-28 LAB — CBC WITH DIFFERENTIAL/PLATELET
Basophils Absolute: 0.1 10*3/uL (ref 0.0–0.1)
Basophils Relative: 1.2 % (ref 0.0–3.0)
Eosinophils Absolute: 0.3 10*3/uL (ref 0.0–0.7)
Eosinophils Relative: 5.7 % — ABNORMAL HIGH (ref 0.0–5.0)
HCT: 46.4 % (ref 39.0–52.0)
Hemoglobin: 16 g/dL (ref 13.0–17.0)
Lymphocytes Relative: 28.8 % (ref 12.0–46.0)
Lymphs Abs: 1.7 10*3/uL (ref 0.7–4.0)
MCHC: 34.5 g/dL (ref 30.0–36.0)
MCV: 92.6 fL (ref 78.0–100.0)
Monocytes Absolute: 0.8 10*3/uL (ref 0.1–1.0)
Monocytes Relative: 13.3 % — ABNORMAL HIGH (ref 3.0–12.0)
Neutro Abs: 3 10*3/uL (ref 1.4–7.7)
Neutrophils Relative %: 51 % (ref 43.0–77.0)
Platelets: 214 10*3/uL (ref 150.0–400.0)
RBC: 5.01 Mil/uL (ref 4.22–5.81)
RDW: 12.7 % (ref 11.5–15.5)
WBC: 5.9 10*3/uL (ref 4.0–10.5)

## 2023-12-28 MED ORDER — LOSARTAN POTASSIUM-HCTZ 50-12.5 MG PO TABS: 1.0000 | ORAL_TABLET | Freq: Every day | ORAL | 1 refills | Status: DC

## 2023-12-28 NOTE — Patient Instructions (Signed)
 We are getting an xray today. We will be in contact with any abnormal results that require further attention.  We are checking labs today, will be in contact with any results that require further attention  Check BP at home once daily in the mornings for the next month. Follow up with me in a month to recheck blood pressures.

## 2023-12-28 NOTE — Addendum Note (Signed)
 Addended by: Sherald Barge on: 12/28/2023 01:10 PM   Modules accepted: Orders

## 2023-12-29 LAB — HEMOGLOBIN A1C: Hgb A1c MFr Bld: 5.2 % (ref 4.6–6.5)

## 2023-12-31 NOTE — Progress Notes (Signed)
   I, Stevenson Clinch, CMA acting as a scribe for Clementeen Graham, MD.  Tony Weber is a 48 y.o. male who presents to [facility] at [facility] today for R foot pain ongoing for about 6 month. He works in [employer]. Pt locates pain to the base of the toes. Hx of ankle injury and reconstruction. Sx come and go. Sx worse in the mornings and following prolonged time on his feet. Denied visible swelling.   Aggravates: increased time on his feet, Treatments tried: ice bottle massage, tennis ball massage, shoes  Dx imaging: 12/28/23 R foot XR  Pertinent review of systems: No fevers or chills  Relevant historical information: Hypertension   Exam:  BP (!) 140/90   Pulse 82   Ht 5\' 9"  (1.753 m)   Wt 236 lb (107 kg)   SpO2 97%   BMI 34.85 kg/m  General: Well Developed, well nourished, and in no acute distress.   MSK: Right foot loss of transverse arch but otherwise relatively normal-appearing Tender palpation plantar second and third metatarsal heads.  Normal foot and ankle motion.    Lab and Radiology Results  DG Foot Complete Right Result Date: 12/28/2023 CLINICAL DATA:  Intermittent right foot pain and swelling for the past 6 months. No injury. EXAM: RIGHT FOOT COMPLETE - 3+ VIEW COMPARISON:  None Available. FINDINGS: No acute fracture or dislocation. Old healed lateral malleolar fracture status post ORIF. 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 Bone mineralization is normal. Soft tissues are unremarkable. IMPRESSION: 1. 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 Electronically Signed   By: Obie Dredge M.D.   On: 12/28/2023 11:25   I, Clementeen Graham, personally (independently) visualized and performed the interpretation of the images attached in this note.       Assessment and Plan: 48 y.o. male with right foot pain.  Differential diagnosis includes metatarsalgia is the most likely  explanation.  Metatarsals stress fracture could be a possibility as well.  Plan for metatarsal pads.  Use postop shoe or cam walker boot if needed.  If not improving he will let me know and we can proceed to MRI or injection.   PDMP not reviewed this encounter. No orders of the defined types were placed in this encounter.  No orders of the defined types were placed in this encounter.    Discussed warning signs or symptoms. Please see discharge instructions. Patient expresses understanding.   The above documentation has been reviewed and is accurate and complete Clementeen Graham, M.D.

## 2024-01-03 ENCOUNTER — Encounter: Payer: Self-pay | Admitting: Family Medicine

## 2024-01-03 ENCOUNTER — Other Ambulatory Visit: Payer: Self-pay

## 2024-01-03 ENCOUNTER — Ambulatory Visit: Payer: Commercial Managed Care - PPO | Admitting: Family Medicine

## 2024-01-03 VITALS — BP 140/90 | HR 82 | Ht 69.0 in | Wt 236.0 lb

## 2024-01-03 DIAGNOSIS — M79671 Pain in right foot: Secondary | ICD-10-CM | POA: Diagnosis not present

## 2024-01-03 DIAGNOSIS — M7741 Metatarsalgia, right foot: Secondary | ICD-10-CM | POA: Diagnosis not present

## 2024-01-03 NOTE — Patient Instructions (Addendum)
 Thank you for coming in today.  I think you have metatarsalgia.   OK to use metatarsal pads from [email or web address] or insoles with metatarsal pads from [employer] or similar.   If you need to ok to use post op shoe or cam walker boot from [facility] supply or [place].   If not better next step is MRI or injection. Let me know.

## 2024-01-05 ENCOUNTER — Ambulatory Visit: Payer: Commercial Managed Care - PPO | Admitting: Plastic Surgery

## 2024-01-05 ENCOUNTER — Encounter: Payer: Self-pay | Admitting: Plastic Surgery

## 2024-01-05 VITALS — BP 165/106 | HR 73 | Ht 69.0 in | Wt 236.6 lb

## 2024-01-05 DIAGNOSIS — L989 Disorder of the skin and subcutaneous tissue, unspecified: Secondary | ICD-10-CM | POA: Diagnosis not present

## 2024-01-05 NOTE — Progress Notes (Signed)
 Referring Provider Moshe Cipro, FNP [address] [place],  [place] [address]   CC:  Chief Complaint  Patient presents with   Advice Only      Tony Weber is an 48 y.o. male.  HPI: Mr. Jurney is a 48 year old male who presents today with a nonhealing 1 cm wound over the right TMJ.  Patient states that he has had a wound there for approximately 18 years but it is recently become nonhealing.  He occasionally nicks it when he is shaving.  No pain or drainage or signs of infection.  Allergies  Allergen Reactions   Amoxicillin Other (See Comments)    In hospital as a kid - not sure about reaction   Chocolate Diarrhea    Outpatient Encounter Medications as of 01/05/2024  Medication Sig   cetirizine (ZYRTEC) 10 MG tablet Take 10 mg by mouth daily.   losartan-hydrochlorothiazide (HYZAAR) 50-12.5 MG tablet Take 1 tablet by mouth daily.   Multiple Vitamins-Minerals (MEGA MULTIVITAMIN FOR MEN) TABS Take 1 tablet by mouth daily.   No facility-administered encounter medications on file as of 01/05/2024.     Past Medical History:  Diagnosis Date   Hyperlipidemia    Hypertension    Shingles     Past Surgical History:  Procedure Laterality Date   ANKLE SURGERY Right    unspecified    Family History  Problem Relation Age of Onset   Liver cancer Father     Social History   Social History Narrative   Has 2 daughters and 2 grandchildren.      Review of Systems General: Denies fevers, chills, weight loss CV: Denies chest pain, shortness of breath, palpitations Skin: 1 cm open wound in the sideburn region over the TMJ  Physical Exam    01/05/2024   12:47 PM 01/03/2024    8:34 AM 12/28/2023    9:36 AM  Vitals with BMI  Height 5\' 9"  5\' 9"  5\' 9"   Weight 236 lbs 10 oz 236 lbs 232 lbs  BMI 34.92 34.84 34.24  Systolic 158 140 462  Diastolic 100 90 98  Pulse 82 82 96    General:  No acute distress,  Alert and oriented, Non-Toxic, Normal speech and  affect Integument: As noted there is a 1 cm nonhealing wound over the right TMJ.   Assessment/Plan Skin lesion right side of face: Patient has a 1 cm wound which has been present for many years but is now nonhealing.  I have recommended strongly to him that this be excised.  I showed him how I would remove the wound and that it would be closed with sutures would need to be removed 5 to 7 days afterwards.  He understands that if this returns with any type of cancer that he will require more extensive resection.  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 All questions answered to his satisfaction.  Photographs obtained today with his consent.  Will schedule him for excision of the skin lesion at his request.  Blood pressure: Patient's blood pressure is quite elevated today at 158/100 and a diastolic of 106 on repeat.  He has been informed that this is quite high and recommendations were that he either proceed to the emergency room or to call his primary care provider to discuss management.  He has recently had a change in his blood pressure medication.  Santiago Glad 01/05/2024, 1:00  PM

## 2024-01-16 ENCOUNTER — Other Ambulatory Visit: Payer: Self-pay | Admitting: Student

## 2024-01-16 DIAGNOSIS — I10 Essential (primary) hypertension: Secondary | ICD-10-CM

## 2024-01-17 NOTE — Telephone Encounter (Signed)
 NOT [facility] PATIENT

## 2024-01-24 NOTE — Progress Notes (Unsigned)
   Established Patient Office Visit  Subjective   Patient ID: Tony Weber, male    DOB: 06-03-1976  Age: 48 y.o. MRN: [id number]  No chief complaint on file.   HPI Patient presents today for medication management. Reports compliance with medication regimen. Denies the need for refills. Not fasting today. Denies other concerns. Medical history as outlined below.  ROS Per HPI    Objective:     There were no vitals taken for this visit.  Physical Exam Vitals and nursing note reviewed.  Constitutional:      Appearance: Normal appearance.  HENT:     Head: Normocephalic and atraumatic.  Eyes:     Extraocular Movements: Extraocular movements intact.  Cardiovascular:     Rate and Rhythm: Normal rate and regular rhythm.     Pulses: Normal pulses.     Heart sounds: Normal heart sounds.  Pulmonary:     Effort: Pulmonary effort is normal.     Breath sounds: Normal breath sounds.  Musculoskeletal:        General: Normal range of motion.     Cervical back: Normal range of motion.  Skin:    General: Skin is warm and dry.  Neurological:     General: No focal deficit present.     Mental Status: He is alert and oriented to person, place, and time.  Psychiatric:        Mood and Affect: Mood normal.        Behavior: Behavior normal.    No results found for any visits on 01/25/24.   The 10-year ASCVD risk score (Arnett DK, et al., 2019) is: 3.9%    Assessment & Plan:   There are no diagnoses linked to this encounter.   No follow-ups on file.    Moshe Cipro, FNP

## 2024-01-25 ENCOUNTER — Encounter: Payer: Self-pay | Admitting: Family Medicine

## 2024-01-25 ENCOUNTER — Ambulatory Visit: Payer: Commercial Managed Care - PPO | Admitting: Family Medicine

## 2024-01-25 VITALS — BP 122/88 | HR 78 | Temp 98.0°F | Ht 69.0 in | Wt 235.8 lb

## 2024-01-25 DIAGNOSIS — Z6834 Body mass index (BMI) 34.0-34.9, adult: Secondary | ICD-10-CM | POA: Diagnosis not present

## 2024-01-25 DIAGNOSIS — Z79899 Other long term (current) drug therapy: Secondary | ICD-10-CM

## 2024-01-25 DIAGNOSIS — I10 Essential (primary) hypertension: Secondary | ICD-10-CM

## 2024-01-25 DIAGNOSIS — E66811 Obesity, class 1: Secondary | ICD-10-CM

## 2024-01-25 DIAGNOSIS — E785 Hyperlipidemia, unspecified: Secondary | ICD-10-CM

## 2024-01-25 NOTE — Patient Instructions (Addendum)
 Continue current medication regimen.   Follow up with me as scheduled, sooner if needed.

## 2024-01-25 NOTE — Assessment & Plan Note (Signed)
 Controlled, continue current medication regimen.

## 2024-01-25 NOTE — Assessment & Plan Note (Signed)
 Continue efforts and healthy diet and activity level

## 2024-01-25 NOTE — Assessment & Plan Note (Signed)
Continue current med regimen

## 2024-02-03 ENCOUNTER — Ambulatory Visit: Admitting: Plastic Surgery

## 2024-02-03 DIAGNOSIS — C44319 Basal cell carcinoma of skin of other parts of face: Secondary | ICD-10-CM

## 2024-02-03 DIAGNOSIS — L989 Disorder of the skin and subcutaneous tissue, unspecified: Secondary | ICD-10-CM

## 2024-02-03 NOTE — Progress Notes (Signed)
 Procedure Note  Preoperative Dx: Open wound right preauricular face  Postoperative Dx: Same  Procedure: Excision of wound and scar  Anesthesia: Lidocaine 1% with 1:100,000 epinephrine and 0.25% Sensorcaine   Indication for Procedure: Removal for pathologic diagnosis  Description of Procedure: Risks and complications were explained to the patient including the need for reexcision based on pathology.  Consent was confirmed and the patient understands the risks and benefits.  The potential complications and alternatives were explained and the patient consents.  The patient expressed understanding the option of not having the procedure and the risks of a scar.  Time out was called and all information was confirmed to be correct.    The area was prepped and drapped.  Local anesthetic was injected in the subcutaneous tissues.  After waiting for the local to take affect an elliptical incision was made around the 1 cm scar and open wound.  After obtaining hemostasis, the surgical wound was closed with interrupted 4-0 Monocryl sutures in the deep tissue and interrupted 5-0 Prolene sutures and the skin.  The surgical wound measured 1.5 cm.  A dressing was applied.  The patient was given instructions on how to care for the area and a follow up appointment.  Tony Weber tolerated the procedure well and there were no complications. The specimen was sent to pathology.

## 2024-02-08 LAB — DERMATOLOGY PATHOLOGY

## 2024-02-10 ENCOUNTER — Ambulatory Visit: Admitting: Student

## 2024-02-10 VITALS — BP 153/107 | HR 83

## 2024-02-10 DIAGNOSIS — C44319 Basal cell carcinoma of skin of other parts of face: Secondary | ICD-10-CM

## 2024-02-10 NOTE — Progress Notes (Signed)
 Patient is a 48 year old male with history of an open wound to the right preauricular face.  He recently underwent excision of the wound and scar with Dr. Ladona Ridgel on 02/03/2024.  Patient is 1 week out from his procedure.  He presents to the clinic today for postprocedural follow-up.  Specimen was sent to pathology.  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  Today, patient reports he is doing well.  He denies any issues with the procedure site.  Denies any drainage, fevers or chills.  Discussed the results of the pathology with the patient.  He expressed understanding.  On exam, patient is sitting upright in no acute distress.  Incision appears to be intact with Prolene sutures.  There is no surrounding erythema, drainage or swelling.  Prolene sutures were removed without any difficulty.  Patient tolerated well.  Recommended that patient apply Vaseline to his incision daily for the next 2 weeks.  Recommended that he transition to scar creams.  I also discussed with him to avoid direct sunlight as this can worsen the scar.  Patient expressed understanding.  Discussed next steps with Dr. Ladona Ridgel in the room.  We discussed options including further excision by Dr. Ladona Ridgel or referral to Mohs surgeon.  Patient opted to be referred to Mohs surgeon, but we discussed with the patient that if it takes too long to get in with the Mohs surgeon that we can go ahead with reexcision here.  Patient expressed understanding was in agreement with this.  I instructed the patient to call if he has any further questions or concerns about anything.

## 2024-03-30 ENCOUNTER — Telehealth: Payer: Self-pay | Admitting: Family Medicine

## 2024-03-30 DIAGNOSIS — R7989 Other specified abnormal findings of blood chemistry: Secondary | ICD-10-CM

## 2024-03-30 NOTE — Telephone Encounter (Signed)
 Pt has scheduled a future lab visit to "recheck liver". No lab orders in, possible this should be an OV with PCP?

## 2024-06-21 ENCOUNTER — Other Ambulatory Visit: Payer: Self-pay | Admitting: Family Medicine

## 2024-06-21 DIAGNOSIS — I10 Essential (primary) hypertension: Secondary | ICD-10-CM

## 2024-06-27 ENCOUNTER — Other Ambulatory Visit: Payer: Commercial Managed Care - PPO

## 2024-06-28 ENCOUNTER — Other Ambulatory Visit

## 2024-10-28 ENCOUNTER — Telehealth: Admitting: Nurse Practitioner

## 2024-10-28 DIAGNOSIS — J069 Acute upper respiratory infection, unspecified: Secondary | ICD-10-CM | POA: Diagnosis not present

## 2024-10-28 MED ORDER — PROMETHAZINE-DM 6.25-15 MG/5ML PO SYRP: 5.0000 mL | ORAL_SOLUTION | Freq: Four times a day (QID) | ORAL | 0 refills | Status: AC | PRN

## 2024-10-28 MED ORDER — IBUPROFEN 600 MG PO TABS: 600.0000 mg | ORAL_TABLET | Freq: Three times a day (TID) | ORAL | 0 refills | Status: AC | PRN

## 2024-10-28 NOTE — Progress Notes (Signed)
 E visit for Flu like symptoms   We are sorry that you are not feeling well.  Here is how we plan to help! Based on what you have shared with me it looks like you may have flu-like symptoms that should be watched but do not seem to indicate anti-viral treatment.  Influenza or the flu is  an infection caused by a respiratory virus. The flu virus is highly contagious and persons who did not receive their yearly flu vaccination may catch the flu from close contact.  We have anti-viral medications to treat the viruses that cause this infection. They are not a cure and only shorten the course of the infection. These prescriptions are most effective when they are given within the first 2 days of flu symptoms. Antiviral medications are indicated if you have a high risk of complications from the flu. You should  also consider an antiviral medication if you are in close contact with someone who is at risk. These medications can help patients avoid complications from the flu but have side effects that you should know.   Possible side effects from Tamiflu or oseltamivir include nausea, vomiting, diarrhea, dizziness, headaches, eye redness, sleep problems or other respiratory symptoms. You should not take Tamiflu if you have an allergy to oseltamivir or any to the ingredients in Tamiflu.   For nasal congestion, you may use an oral decongestant such as Mucinex D or if you have glaucoma or high blood pressure use plain Mucinex.  Saline nasal spray or nasal drops can help and can safely be used as often as needed for congestion.  If you have a sore or scratchy throat, use a saltwater gargle-  to  teaspoon of salt dissolved in a 4-ounce to 8-ounce glass of warm water.  Gargle the solution for approximately 15-30 seconds and then spit.  It is important not to swallow the solution.  You can also use throat lozenges/cough drops and Chloraseptic spray to help with throat pain or discomfort.  Warm or cold liquids  can also be helpful in relieving throat pain.  For headache, pain or general discomfort, you can use Ibuprofen  or Tylenol as directed.   Some authorities believe that zinc sprays or the use of Echinacea may shorten the course of your symptoms.  I have prescribed the following medications to help lessen symptoms: Motrin  for body aches and cough syrup for cough  You are to isolate at home until you have been fever-free for at least 24 hours without a fever-reducing medication, and symptoms have been steadily improving for 24 hours.  If you must be around other household members who do not have symptoms, you need to make sure that both you and the family members are masking consistently with a high-quality mask.  If you note any worsening of symptoms despite treatment, please seek an in-person evaluation ASAP. If you note any significant shortness of breath or any chest pain, please seek ED evaluation. Please do not delay care!  ANYONE WHO HAS FLU SYMPTOMS SHOULD: Stay home. The flu is highly contagious and going out or to work exposes others! Be sure to drink plenty of fluids. Water is fine as well as fruit juices, sodas and electrolyte beverages. You may want to stay away from caffeine or alcohol. If you are nauseated, try taking small sips of liquids. How do you know if you are getting enough fluid? Your urine should be a pale yellow or almost colorless. Get rest. Taking a steamy shower or using  a humidifier may help nasal congestion and ease sore throat pain. Using a saline nasal spray works much the same way. Cough drops, hard candies and sore throat lozenges may ease your cough. Line up a caregiver. Have someone check on you regularly.  GET HELP RIGHT AWAY IF: You cannot keep down liquids or your medications. You become short of breath Your fell like you are going to pass out or loose consciousness. Your symptoms persist after you have completed your treatment plan  MAKE SURE YOU   Understand these instructions. Will watch your condition. Will get help right away if you are not doing well or get worse.  Your e-visit answers were reviewed by a board certified advanced clinical practitioner to complete your personal care plan.  Depending on the condition, your plan could have included both over the counter or prescription medications.  If there is a problem please reply  once you have received a response from your provider.  Your safety is important to us .  If you have drug allergies check your prescription carefully.    You can use MyChart to ask questions about todays visit, request a non-urgent call back, or ask for a work or school excuse for 24 hours related to this e-Visit. If it has been greater than 24 hours you will need to follow up with your provider, or enter a new e-Visit to address those concerns.  You will get an e-mail in the next two days asking about your experience.  I hope that your e-visit has been valuable and will speed your recovery. Thank you for using e-visits.   I have spent 5 minutes in review of e-visit questionnaire, review and updating patient chart, medical decision making and response to patient.   Jadae Steinke W Nahzir Pohle, NP
